# Patient Record
Sex: Female | Born: 1985 | Race: Black or African American | Hispanic: No | Marital: Married | Smoking: Never smoker
Health system: Southern US, Community
[De-identification: ages and names within clinical notes are randomized; demographics above are authoritative.]

---

## 2004-01-02 ENCOUNTER — Emergency Department (HOSPITAL_COMMUNITY): Admission: EM | Admit: 2004-01-02 | Discharge: 2004-01-03 | Payer: Self-pay

## 2006-03-29 ENCOUNTER — Other Ambulatory Visit: Admission: RE | Admit: 2006-03-29 | Discharge: 2006-03-29 | Payer: Self-pay | Admitting: Obstetrics and Gynecology

## 2006-07-04 ENCOUNTER — Ambulatory Visit (HOSPITAL_COMMUNITY): Admission: RE | Admit: 2006-07-04 | Discharge: 2006-07-04 | Payer: Self-pay | Admitting: Obstetrics and Gynecology

## 2006-11-08 ENCOUNTER — Inpatient Hospital Stay (HOSPITAL_COMMUNITY): Admission: AD | Admit: 2006-11-08 | Discharge: 2006-11-11 | Payer: Self-pay | Admitting: Obstetrics and Gynecology

## 2006-12-09 ENCOUNTER — Other Ambulatory Visit: Admission: RE | Admit: 2006-12-09 | Discharge: 2006-12-09 | Payer: Self-pay | Admitting: Obstetrics and Gynecology

## 2007-09-02 IMAGING — US US OB DETAIL+14 WK
1 series · 13 of 28 positions shown · non-contrast
Comparison: none

CLINICAL DATA: 22 week 0 day gestational age by LMP.  Evaluate dating and anatomy.  Echogenic intracardiac focus noted during real time exam.

[Series 1: us ob detail+14 wk · 67 acquisitions, 13 frames shown]
[im 3/67]
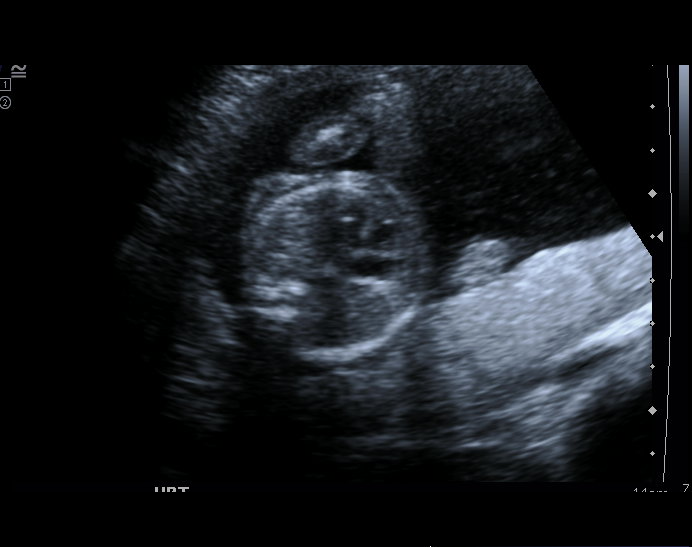
[im 8/67]
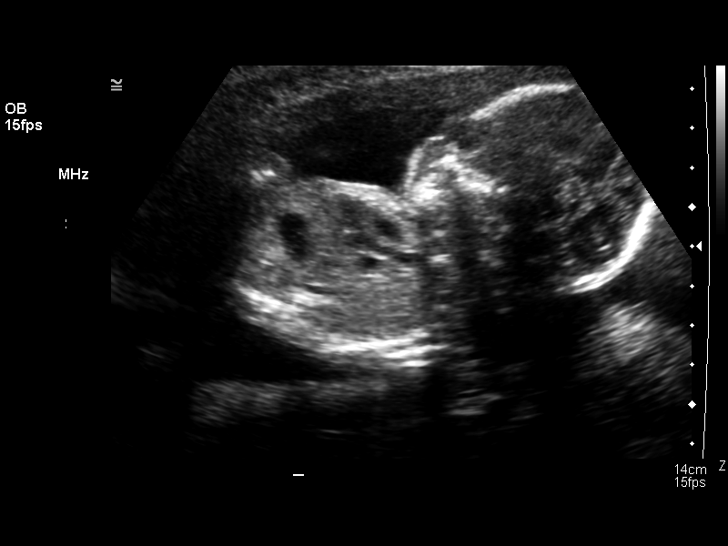
[im 13/67]
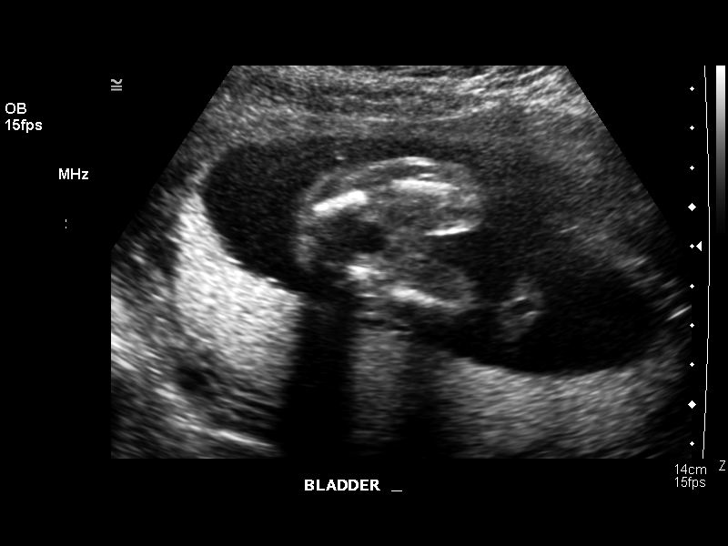
[im 18/67]
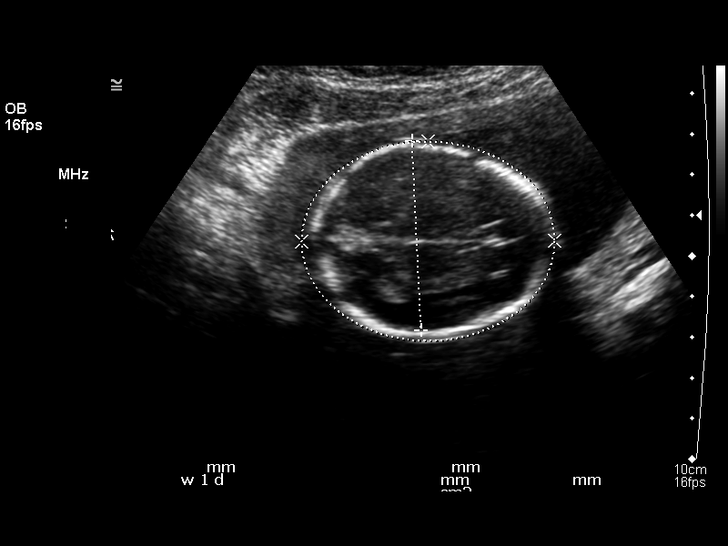
[im 23/67]
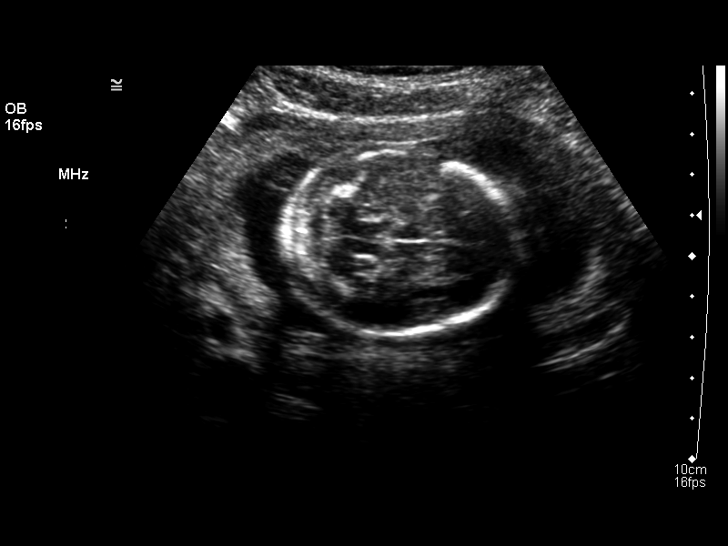
[im 27/67]
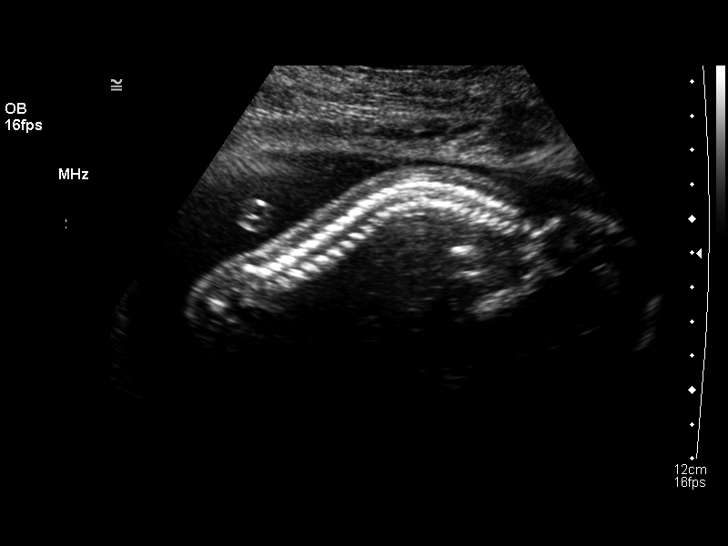
[im 35/67]
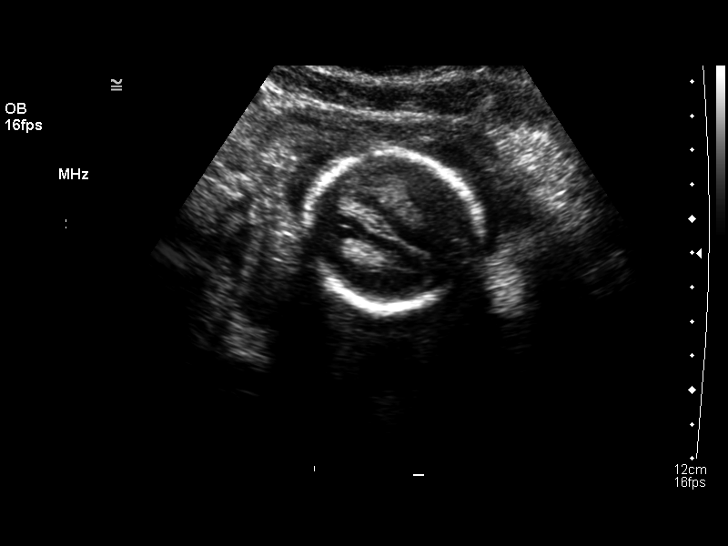
[im 40/67]
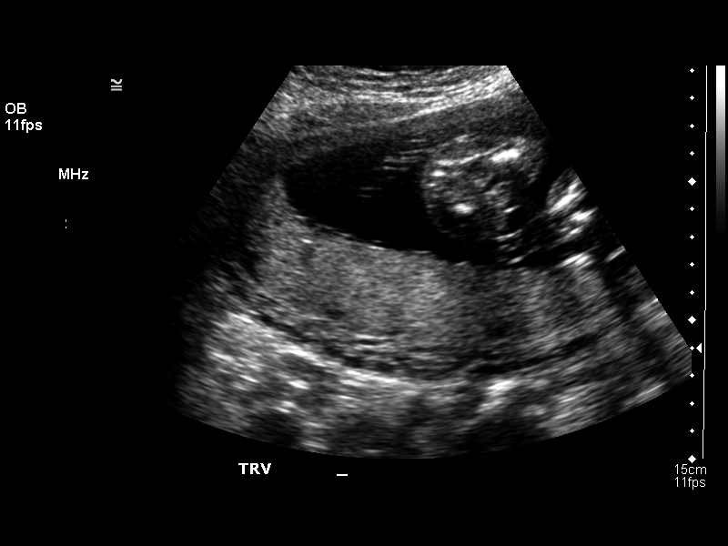
[im 45/67]
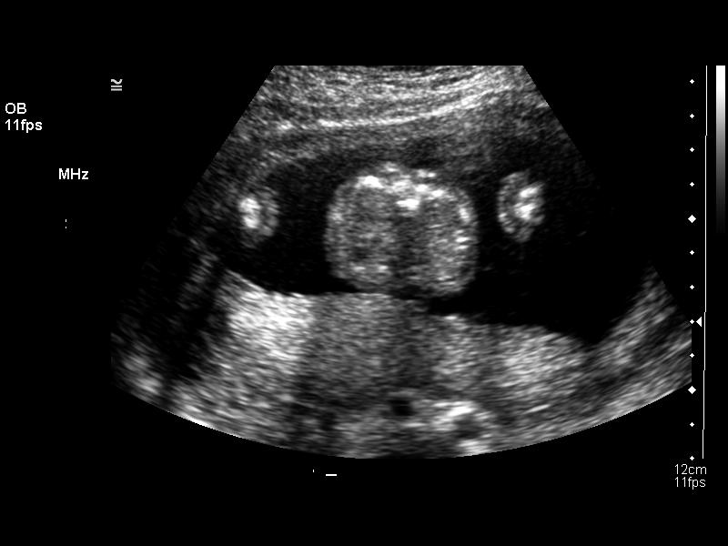
[im 49/67]
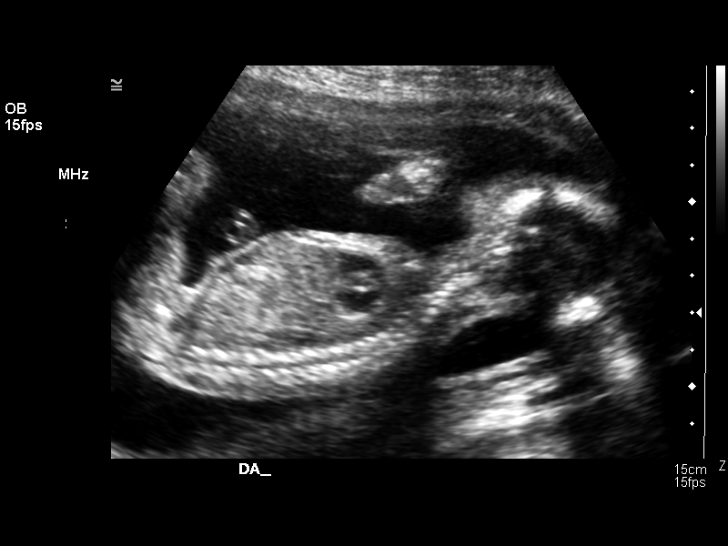
[im 54/67]
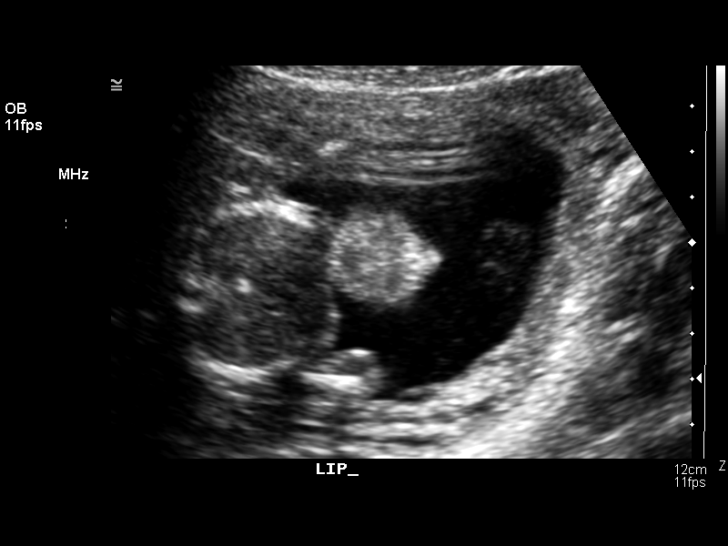
[im 59/67]
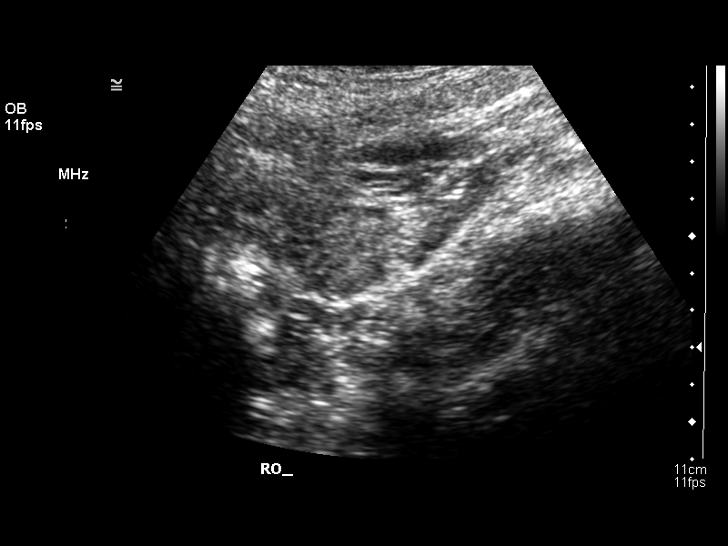
[im 64/67]
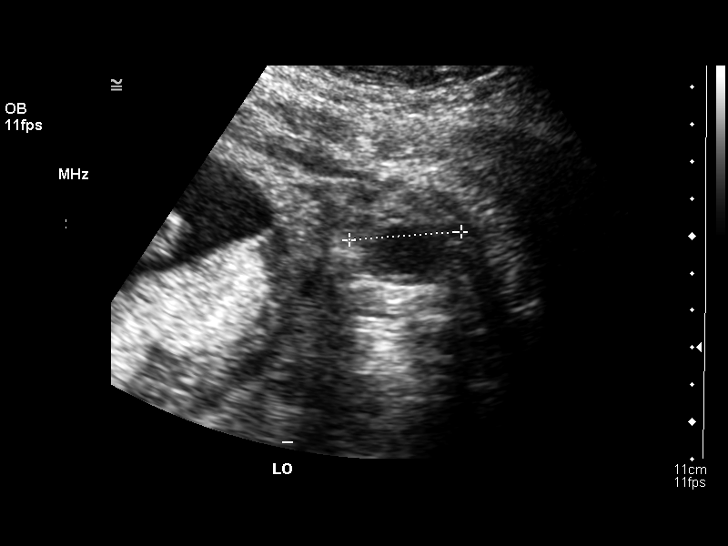

[13 of 28 positions shown; findings below may reference images not displayed]

DETAILED OBSTETRICAL ULTRASOUND:

Number of Fetuses:  1
Heart Rate:  157 bpm
Movement:  Yes
Breathing:  No
Presentation:  Cephalic
Placental Location:  Posterior
Grade:  I
Previa:  No
Amniotic Fluid (Subjective):  Normal
Amniotic Fluid (Objective):  5.5 cm vertical pocket 

FETAL BIOMETRY
BPD:  4.6 cm   20 w 0 d
HC:  17.4 cm  19 w 6 d
AC:  14.5 cm  19 w 6 d
FL:  3.2 cm  20 w 0 d

MEAN GA:  20 w 0 d    US EDC:  11/21/06

FETAL ANATOMY
Lateral Ventricles:  Visualized  
Thalami/CSP:  Visualized  
Posterior Fossa:  Visualized   
Nuchal Region:  (NF=4.0 mm)  Visualized 
Spine:  Visualized  
4 Chamber Heart on Left:  Visualized  
Stomach on Left:  Visualized  
3 Vessel Cord:  Visualized 
Cord Insertion site:  Visualized 
Kidneys:  Visualized   
Bladder:  Visualized   
Extremities:  Visualized  

ADDITIONAL ANATOMY VISUALIZED:  LVOT, RVOT, upper lip, orbits, profile, diaphragm, heel, 5th digit, ductal arch, and aortic arch.

Comment:  An echogenic intracardiac focus is noted in the fetal left ventricle.  No other sonographic markers for aneuploidy are identified.  This is considered a soft sonographic marker for Down syndrome, and increases the age-based risk for 20 year old from [DATE] to [DATE].

MATERNAL UTERINE AND ADNEXAL FINDINGS
Cervix:  4.2 cm transabdominal.
Both ovaries are unremarkable.
IMPRESSION: 1.  Single living intrauterine fetus with mean gestational age of 20 weeks 0 days and US EDC of 11/21/06.  This is 2 weeks behind stated LMP, indicating that LMP is inaccurate.
2.  No evidence of fetal anomalies.  Echogenic intracardiac focus noted, which is considered a soft sonographic marker for Down syndrome.  See above comments.  Recommend correlation with results of maternal serum screening for further risk adjustment if available.

## 2009-03-11 ENCOUNTER — Ambulatory Visit (HOSPITAL_COMMUNITY): Admission: RE | Admit: 2009-03-11 | Discharge: 2009-03-11 | Payer: Self-pay | Admitting: Obstetrics

## 2009-03-28 ENCOUNTER — Observation Stay (HOSPITAL_COMMUNITY): Admission: AD | Admit: 2009-03-28 | Discharge: 2009-03-28 | Payer: Self-pay | Admitting: Obstetrics

## 2010-05-27 IMAGING — US US OB LIMITED
1 series · 14 of 18 positions shown · non-contrast
Comparison: none

OBSTETRICAL ULTRASOUND:
 This ultrasound was performed in The [HOSPITAL], and the [facility] report will be stored to [REDACTED] PACS.

[Series 1: us ob limited · 14 of 18 slices shown]
[im 1/18]
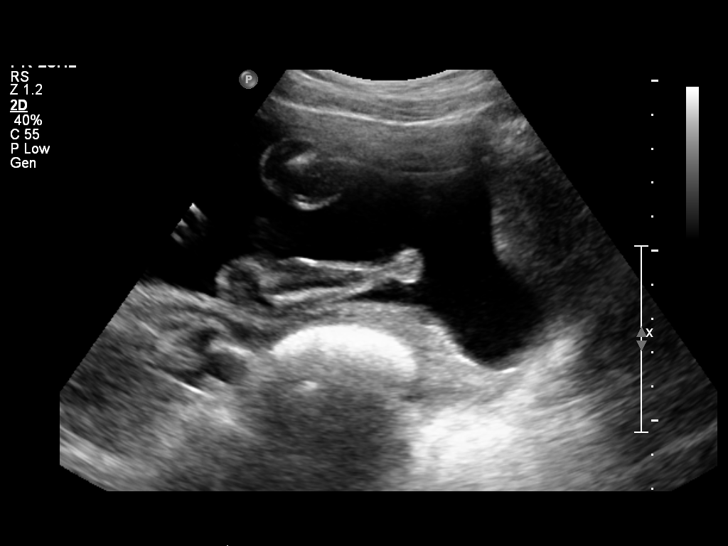
[im 2/18]
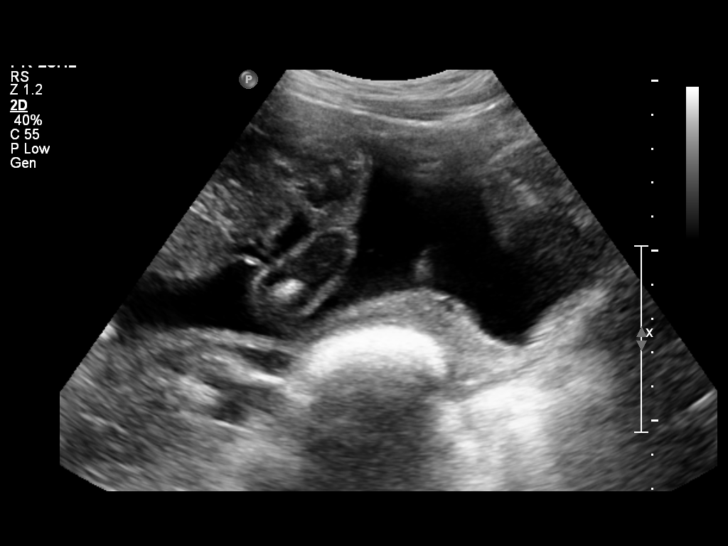
[im 4/18]
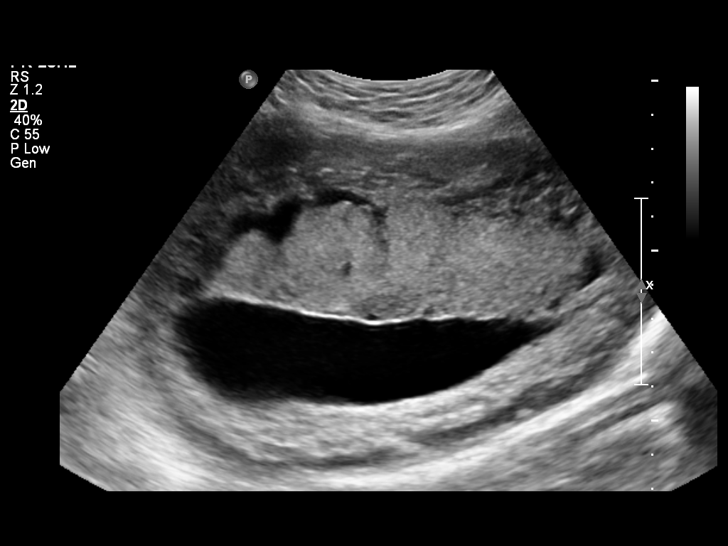
[im 5/18]
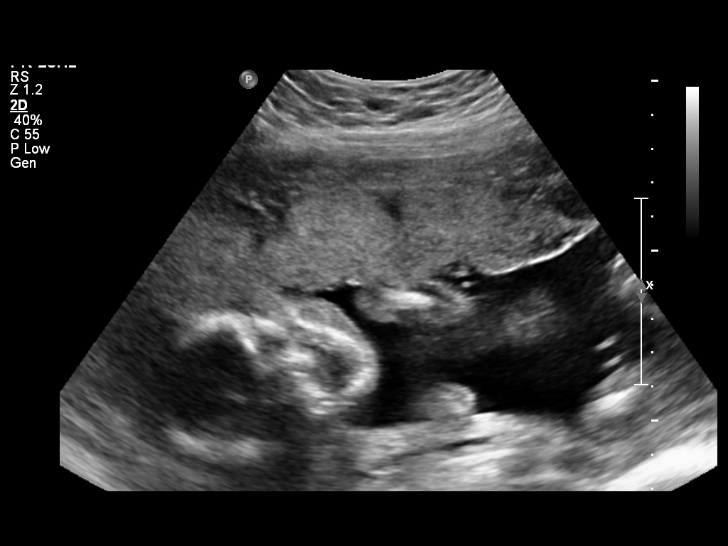
[im 6/18]
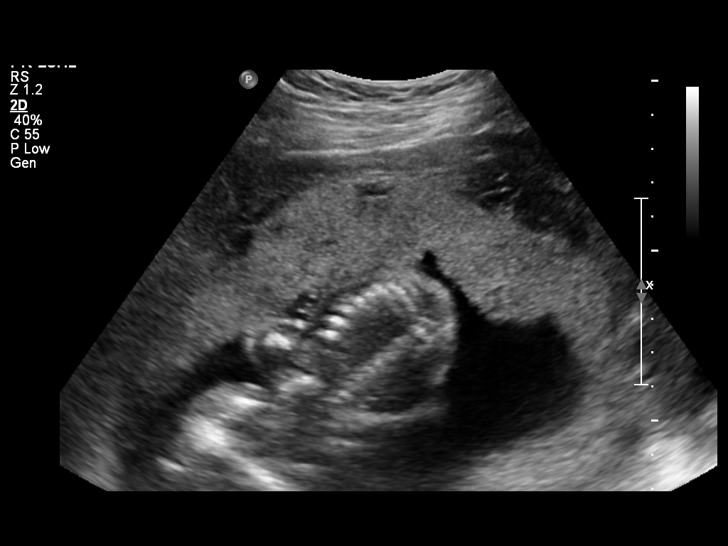
[im 8/18]
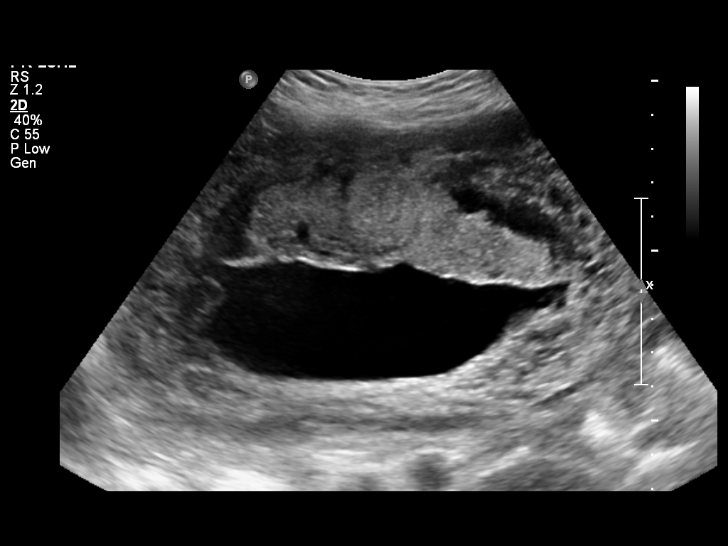
[im 9/18]
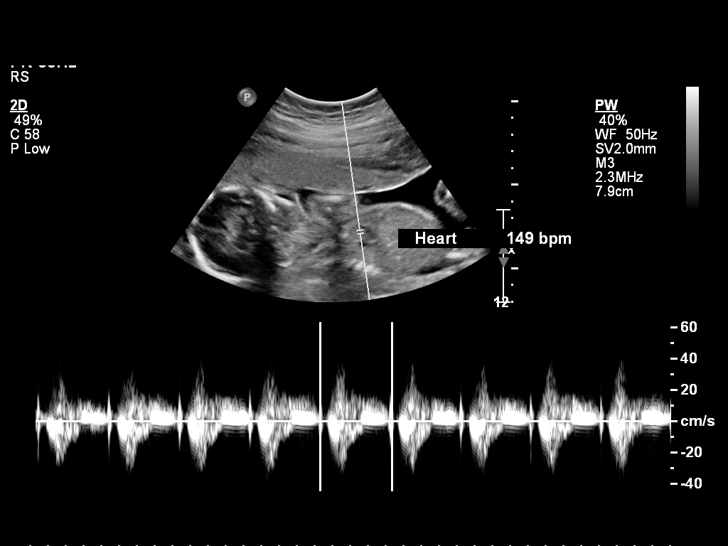
[im 10/18]
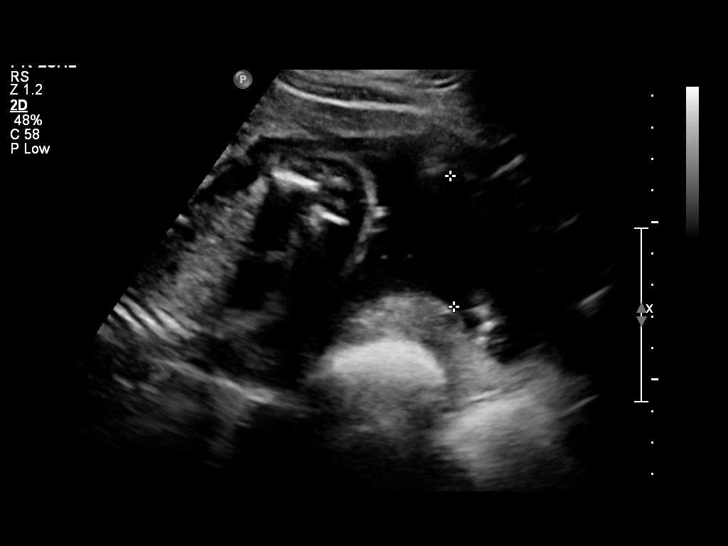
[im 11/18]
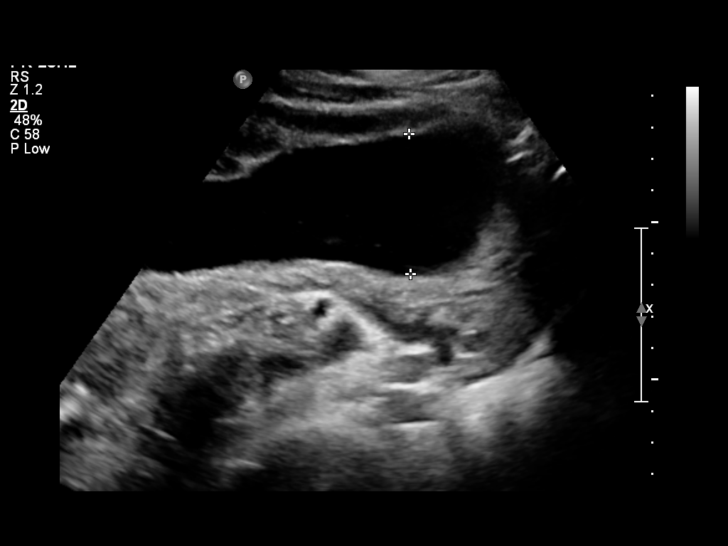
[im 13/18]
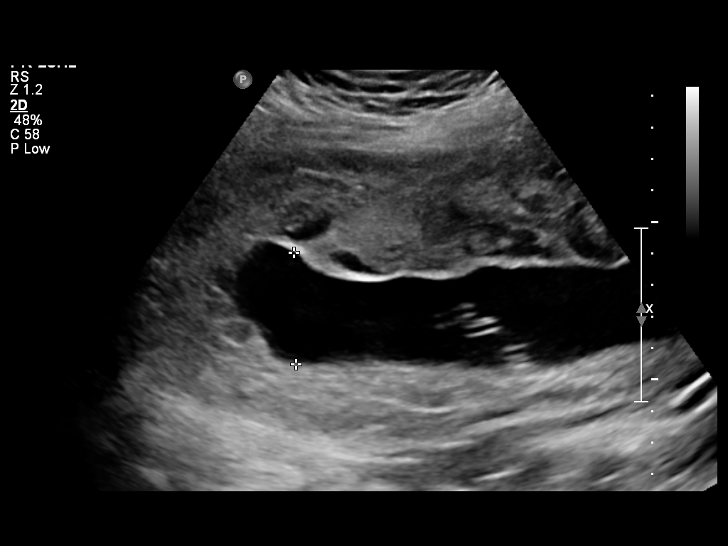
[im 14/18]
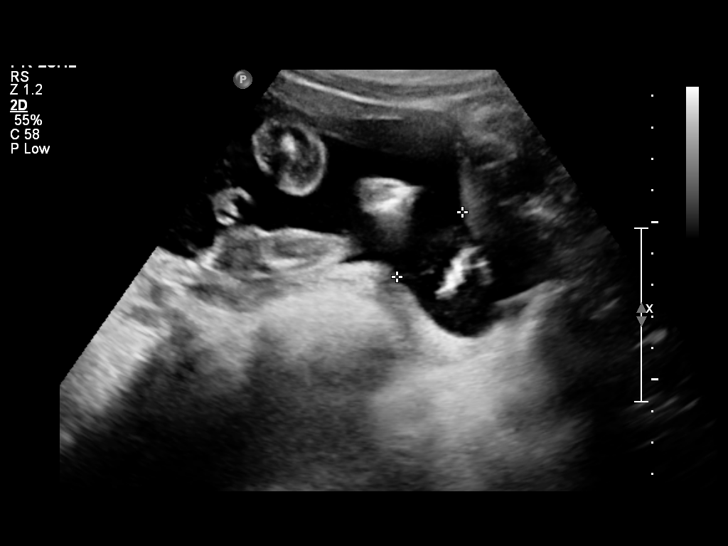
[im 15/18]
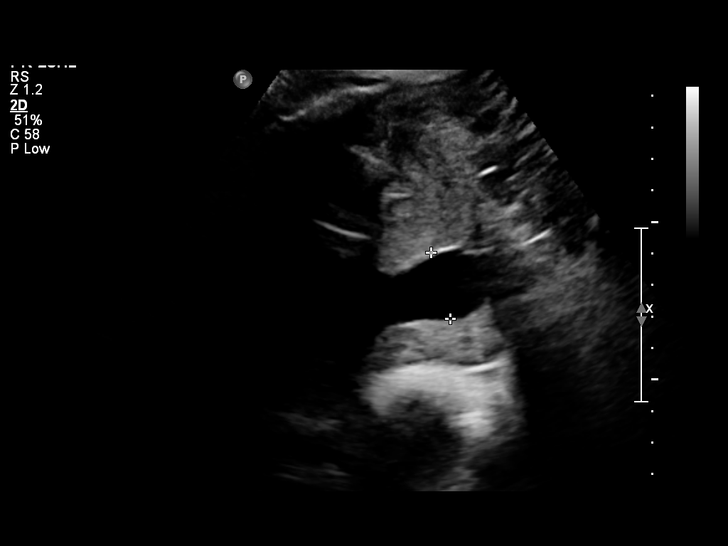
[im 17/18]
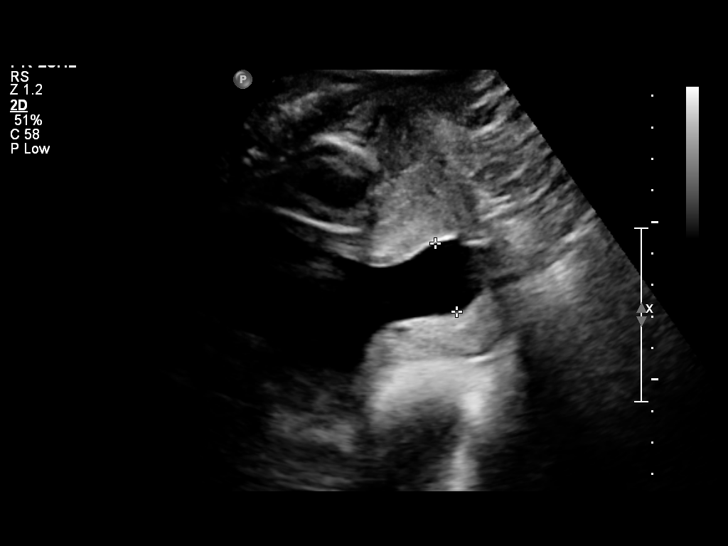
[im 18/18]
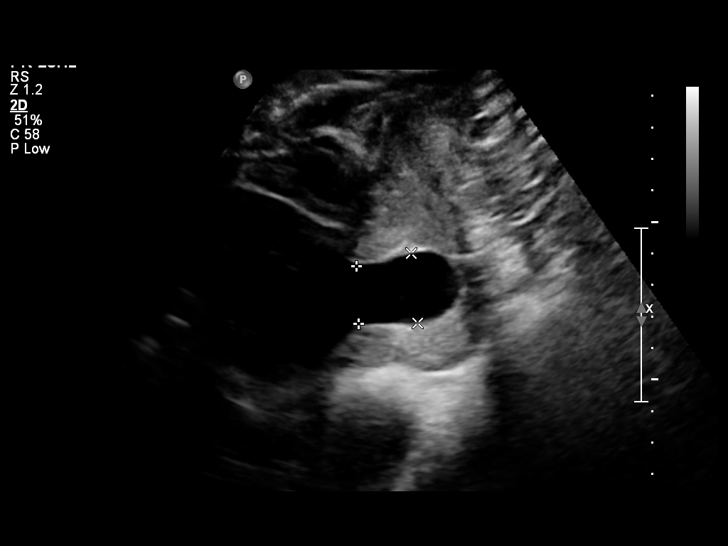

[14 of 18 positions shown; findings below may reference images not displayed]

IMPRESSION: [facility] has also been faxed to the ordering physician.

## 2010-12-07 ENCOUNTER — Encounter: Payer: Self-pay | Admitting: Obstetrics & Gynecology

## 2011-02-23 LAB — CBC
HCT: 34.4 % — ABNORMAL LOW (ref 36.0–46.0)
Hemoglobin: 11.9 g/dL — ABNORMAL LOW (ref 12.0–15.0)
MCHC: 34.5 g/dL (ref 30.0–36.0)
MCV: 92.5 fL (ref 78.0–100.0)
Platelets: 222 10*3/uL (ref 150–400)
RBC: 3.72 MIL/uL — ABNORMAL LOW (ref 3.87–5.11)
RDW: 13.3 % (ref 11.5–15.5)
WBC: 8.1 10*3/uL (ref 4.0–10.5)

## 2011-04-02 NOTE — Discharge Summary (Signed)
Ashley Mcdowell, Ashley Mcdowell               ACCOUNT NO.:  [id number]   MEDICAL RECORD NO.:  [id number]          PATIENT TYPE:  INP   LOCATION:  9106                          FACILITY:  [facility]   PHYSICIAN:  Rudy Jew. Ashley Royalty, M.D.DATE OF BIRTH:  1986-06-02   DATE OF ADMISSION:  11/08/2006  DATE OF DISCHARGE:  11/11/2006                               DISCHARGE SUMMARY   DISCHARGE DIAGNOSES:  1. Intrauterine pregnancy at term, delivered.  2. Term birth living child, vertex.  3. Group B strep carrier.   OPERATIONS AND PROCEDURES:  OB delivery with episiotomy, episiorrhaphy.   CONSULTATIONS:  None.   DISCHARGE MEDICATIONS:  Motrin 60 mg.   HISTORY AND PHYSICAL:  This is a 25 year old primigravida at 72-weeks 6-  days gestation.  Prenatal care was essentially uncomplicated.  The  patient presented at 4:00 p.m. December 25 complaining of possible  rupture of membranes.  In maternity admissions, no premature rupture of  membranes could be confirmed.  However, the patient was noted to have  frequent contractions.  The cervix was noted to be 4 cm dilated, 80%  effaced, -2 station, vertex presentation.  The patient was hence  admitted for delivery.  She is group B strep positive.  Initial cervical  examination revealed cervix to be approximately 4+ cm dilated, 70%  effaced, -2 station, vertex presentation.  For the remainder of the  history physical, please see chart.   HOSPITAL COURSE:  The patient was admitted to [facility]  [place].  Admission laboratory studies were drawn.  Artificial  rupture of membranes was accomplished which revealed clear fluid.  Intrauterine pressure catheter was placed.  The patient went on to labor  and deliver on November 09, 2006.  The infant was a 5-pound, 15-ounces  female, Apgars 8 at one minute and 9 at five minutes, sent to newborn  nursery.  Delivery was accomplished by Dr. Sylvester Harder over a second-  degree midline episiotomy which was repaired  without difficulty.  Delivery was accomplished with the vacuum extractor secondary to  inadequate expulsive effort.  There were no complications.  The patient  received a spinal anesthetic as well as local - 1% Xylocaine  (approximately 10 mL total).  The patient's postpartum course was  benign.  She was discharged on the second postpartum day afebrile and in  satisfactory condition.   DISPOSITION:  The patient is to return in approximately 6 weeks for  postoperative evaluation.      James A. Ashley Royalty, M.D.  Electronically Signed     JAM/MEDQ  D:  11/30/2006  T:  11/30/2006  Job:  [id number]

## 2012-12-22 ENCOUNTER — Ambulatory Visit: Payer: Self-pay | Admitting: Family Medicine

## 2012-12-22 VITALS — BP 116/73 | HR 91 | Temp 100.6°F | Resp 18 | Ht 62.5 in | Wt 145.0 lb

## 2012-12-22 DIAGNOSIS — J020 Streptococcal pharyngitis: Secondary | ICD-10-CM

## 2012-12-22 DIAGNOSIS — R6889 Other general symptoms and signs: Secondary | ICD-10-CM

## 2012-12-22 DIAGNOSIS — J111 Influenza due to unidentified influenza virus with other respiratory manifestations: Secondary | ICD-10-CM

## 2012-12-22 DIAGNOSIS — J029 Acute pharyngitis, unspecified: Secondary | ICD-10-CM

## 2012-12-22 LAB — POCT INFLUENZA A/B
Influenza A, POC: NEGATIVE
Influenza B, POC: NEGATIVE

## 2012-12-22 LAB — POCT RAPID STREP A (OFFICE): Rapid Strep A Screen: POSITIVE — AB

## 2012-12-22 MED ORDER — AMOXICILLIN 875 MG PO TABS: 875.0000 mg | ORAL_TABLET | Freq: Two times a day (BID) | ORAL | Status: DC

## 2012-12-22 NOTE — Patient Instructions (Signed)

## 2012-12-22 NOTE — Progress Notes (Signed)
  [facility]:  Office Visit  Chief Complaint:  Chief Complaint  Patient presents with  . Chills    yesterday  . Fever  . Generalized Body Aches    HPI: Ashley Mcdowell is a 27 y.o. female who complains of  Fevers Tmax 100, chills and msk aches. She has 28 y/o and  28 y/o. No  sick contacts with kids. Her sister had the flu. Took ibuprofen without relief. Has not had flu vaccine, but both her children did.   History reviewed. No pertinent past medical history. History reviewed. No pertinent past surgical history. History   Social History  . Marital Status: Married    Spouse Name: N/A    Number of Children: N/A  . Years of Education: N/A   Social History Main Topics  . Smoking status: Never Smoker   . Smokeless tobacco: None  . Alcohol Use: No  . Drug Use: No  . Sexually Active: Yes   Other Topics Concern  . None   Social History Narrative  . None   History reviewed. No pertinent family history. No Known Allergies Prior to Admission medications   Medication Sig Start Date End Date Taking? Authorizing Provider  UNABLE TO FIND Ripped [employer] Vitamines   Yes Historical Provider, MD     ROS: The patient denies night sweats, unintentional weight loss, chest pain, palpitations, wheezing, dyspnea on exertion, nausea, vomiting, abdominal pain, dysuria, hematuria, melena, numbness, weakness, or tingling.  All other systems have been reviewed and were otherwise negative with the exception of those mentioned in the HPI and as above.    PHYSICAL EXAM: Filed Vitals:   12/22/12 1229  BP: 116/73  Pulse: 91  Temp: 100.6 F (38.1 C)  Resp: 18   Filed Vitals:   12/22/12 1229  Height: 5' 2.5" (1.588 m)  Weight: 145 lb (65.772 kg)   Body mass index is 26.10 kg/(m^2).  General: Alert, no acute distress HEENT:  Normocephalic, atraumatic, oropharynx patent. TM bulging but WNL. + erythematous throat. No exudates. NO sinus tenderness.  Cardiovascular:  Regular  rate and rhythm, no rubs murmurs or gallops.  No Carotid bruits, radial pulse intact. No pedal edema.  Respiratory: Clear to auscultation bilaterally.  No wheezes, rales, or rhonchi.  No cyanosis, no use of accessory musculature GI: No organomegaly, abdomen is soft and non-tender, positive bowel sounds.  No masses. Skin: No rashes. Neurologic: Facial musculature symmetric. Psychiatric: Patient is appropriate throughout our interaction. Lymphatic: No cervical lymphadenopathy Musculoskeletal: Gait intact.   LABS: Results for orders placed in visit on 12/22/12  POCT INFLUENZA A/B      Component Value Range   Influenza A, POC Negative     Influenza B, POC Negative    POCT RAPID STREP A (OFFICE)      Component Value Range   Rapid Strep A Screen Positive (*) Negative     EKG/XRAY:   Primary read interpreted by Dr. Conley Rolls at [facility].   ASSESSMENT/PLAN: Encounter Diagnoses  Name Primary?  . Flu-like symptoms   . Pharyngitis   . Strep throat Yes   Strep Pharyngitis Rx Amoxacillin F/u prn    Matej Sappenfield PHUONG, DO 12/22/2012 1:23 PM

## 2014-07-04 ENCOUNTER — Encounter (HOSPITAL_COMMUNITY): Payer: Self-pay | Admitting: Emergency Medicine

## 2014-07-04 ENCOUNTER — Emergency Department (HOSPITAL_COMMUNITY)
Admission: EM | Admit: 2014-07-04 | Discharge: 2014-07-04 | Disposition: A | Discharge status: Home / Self Care | Payer: No Typology Code available for payment source | Attending: Emergency Medicine | Admitting: Emergency Medicine

## 2014-07-04 DIAGNOSIS — Y92410 Unspecified street and highway as the place of occurrence of the external cause: Secondary | ICD-10-CM | POA: Insufficient documentation

## 2014-07-04 DIAGNOSIS — S4980XA Other specified injuries of shoulder and upper arm, unspecified arm, initial encounter: Secondary | ICD-10-CM | POA: Diagnosis not present

## 2014-07-04 DIAGNOSIS — Z792 Long term (current) use of antibiotics: Secondary | ICD-10-CM | POA: Diagnosis not present

## 2014-07-04 DIAGNOSIS — Y9389 Activity, other specified: Secondary | ICD-10-CM | POA: Insufficient documentation

## 2014-07-04 DIAGNOSIS — M62838 Other muscle spasm: Secondary | ICD-10-CM | POA: Diagnosis not present

## 2014-07-04 DIAGNOSIS — S46909A Unspecified injury of unspecified muscle, fascia and tendon at shoulder and upper arm level, unspecified arm, initial encounter: Secondary | ICD-10-CM | POA: Diagnosis not present

## 2014-07-04 DIAGNOSIS — M25511 Pain in right shoulder: Secondary | ICD-10-CM

## 2014-07-04 MED ORDER — OXYCODONE-ACETAMINOPHEN 5-325 MG PO TABS: 1.0000 | ORAL_TABLET | ORAL | Status: DC | PRN

## 2014-07-04 MED ORDER — METHOCARBAMOL 500 MG PO TABS: 500.0000 mg | ORAL_TABLET | Freq: Two times a day (BID) | ORAL | Status: DC

## 2014-07-04 NOTE — Discharge Instructions (Signed)
Take the prescribed medication as directed.  You will likely continue to have some soreness for the next few days which is normal. Follow-up with your primary care physician if problems occur. Return to the ED for new or worsening symptoms.
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
## 2014-07-04 NOTE — ED Provider Notes (Signed)
Medical screening examination/treatment/procedure(s) were performed by non-physician practitioner and as supervising physician I was immediately available for consultation/collaboration.   Quaron Delacruz T Keyera Hattabaugh, MD 07/04/14 2308 

## 2014-08-28 ENCOUNTER — Ambulatory Visit: Payer: Self-pay | Attending: Orthopedic Surgery | Admitting: Physical Therapy

## 2014-08-28 DIAGNOSIS — M542 Cervicalgia: Secondary | ICD-10-CM | POA: Insufficient documentation

## 2014-09-10 ENCOUNTER — Ambulatory Visit: Payer: Self-pay | Admitting: Physical Therapy

## 2014-09-11 ENCOUNTER — Ambulatory Visit: Payer: Self-pay | Admitting: Physical Therapy

## 2014-09-12 ENCOUNTER — Ambulatory Visit: Payer: Self-pay | Admitting: Physical Therapy

## 2014-09-13 ENCOUNTER — Ambulatory Visit: Payer: Self-pay | Admitting: Physical Therapy

## 2014-09-18 ENCOUNTER — Ambulatory Visit: Payer: No Typology Code available for payment source | Attending: Orthopedic Surgery | Admitting: Physical Therapy

## 2014-09-18 DIAGNOSIS — M542 Cervicalgia: Secondary | ICD-10-CM | POA: Insufficient documentation

## 2014-09-23 ENCOUNTER — Ambulatory Visit: Payer: No Typology Code available for payment source | Admitting: Physical Therapy

## 2014-09-23 DIAGNOSIS — M542 Cervicalgia: Secondary | ICD-10-CM | POA: Diagnosis not present

## 2014-09-27 ENCOUNTER — Ambulatory Visit: Payer: No Typology Code available for payment source | Admitting: Physical Therapy

## 2015-08-19 ENCOUNTER — Encounter: Payer: Self-pay | Admitting: Emergency Medicine

## 2016-12-03 ENCOUNTER — Ambulatory Visit: Payer: Self-pay | Admitting: Nurse Practitioner

## 2016-12-15 ENCOUNTER — Other Ambulatory Visit (INDEPENDENT_AMBULATORY_CARE_PROVIDER_SITE_OTHER)

## 2016-12-15 ENCOUNTER — Encounter: Payer: Self-pay | Admitting: Nurse Practitioner

## 2016-12-15 ENCOUNTER — Ambulatory Visit (INDEPENDENT_AMBULATORY_CARE_PROVIDER_SITE_OTHER): Admitting: Nurse Practitioner

## 2016-12-15 ENCOUNTER — Other Ambulatory Visit (HOSPITAL_COMMUNITY)
Admission: RE | Admit: 2016-12-15 | Discharge: 2016-12-15 | Disposition: A | Discharge status: Home / Self Care | Source: Ambulatory Visit | Attending: Nurse Practitioner | Admitting: Nurse Practitioner

## 2016-12-15 VITALS — BP 126/78 | HR 75 | Temp 97.5°F | Ht 63.0 in | Wt 197.0 lb

## 2016-12-15 DIAGNOSIS — Z0000 Encounter for general adult medical examination without abnormal findings: Secondary | ICD-10-CM

## 2016-12-15 DIAGNOSIS — Z23 Encounter for immunization: Secondary | ICD-10-CM

## 2016-12-15 DIAGNOSIS — Z113 Encounter for screening for infections with a predominantly sexual mode of transmission: Secondary | ICD-10-CM | POA: Diagnosis present

## 2016-12-15 DIAGNOSIS — L309 Dermatitis, unspecified: Secondary | ICD-10-CM | POA: Diagnosis not present

## 2016-12-15 DIAGNOSIS — Z124 Encounter for screening for malignant neoplasm of cervix: Secondary | ICD-10-CM

## 2016-12-15 DIAGNOSIS — Z01419 Encounter for gynecological examination (general) (routine) without abnormal findings: Secondary | ICD-10-CM | POA: Diagnosis not present

## 2016-12-15 DIAGNOSIS — L2082 Flexural eczema: Secondary | ICD-10-CM | POA: Insufficient documentation

## 2016-12-15 LAB — CBC WITH DIFFERENTIAL/PLATELET
BASOS PCT: 0.8 % (ref 0.0–3.0)
Basophils Absolute: 0 10*3/uL (ref 0.0–0.1)
EOS ABS: 0.1 10*3/uL (ref 0.0–0.7)
EOS PCT: 1.8 % (ref 0.0–5.0)
HEMATOCRIT: 40.3 % (ref 36.0–46.0)
HEMOGLOBIN: 13.3 g/dL (ref 12.0–15.0)
Lymphocytes Relative: 42.9 % (ref 12.0–46.0)
Lymphs Abs: 2.4 10*3/uL (ref 0.7–4.0)
MCHC: 33.1 g/dL (ref 30.0–36.0)
MCV: 89.9 fl (ref 78.0–100.0)
MONO ABS: 0.4 10*3/uL (ref 0.1–1.0)
Monocytes Relative: 6.4 % (ref 3.0–12.0)
NEUTROS ABS: 2.7 10*3/uL (ref 1.4–7.7)
Neutrophils Relative %: 48.1 % (ref 43.0–77.0)
PLATELETS: 297 10*3/uL (ref 150.0–400.0)
RBC: 4.48 Mil/uL (ref 3.87–5.11)
RDW: 13.3 % (ref 11.5–15.5)
WBC: 5.6 10*3/uL (ref 4.0–10.5)

## 2016-12-15 LAB — COMPREHENSIVE METABOLIC PANEL
ALBUMIN: 4.5 g/dL (ref 3.5–5.2)
ALT: 39 U/L — ABNORMAL HIGH (ref 0–35)
AST: 37 U/L (ref 0–37)
Alkaline Phosphatase: 49 U/L (ref 39–117)
BUN: 11 mg/dL (ref 6–23)
CHLORIDE: 107 meq/L (ref 96–112)
CO2: 25 meq/L (ref 19–32)
CREATININE: 0.87 mg/dL (ref 0.40–1.20)
Calcium: 9.7 mg/dL (ref 8.4–10.5)
GFR: 97.59 mL/min (ref >60.00)
Glucose, Bld: 81 mg/dL (ref 70–99)
POTASSIUM: 3.6 meq/L (ref 3.5–5.1)
SODIUM: 139 meq/L (ref 135–145)
Total Bilirubin: 0.5 mg/dL (ref 0.2–1.2)
Total Protein: 8.5 g/dL — ABNORMAL HIGH (ref 6.0–8.3)

## 2016-12-15 LAB — LIPID PANEL
CHOL/HDL RATIO: 4
CHOLESTEROL: 176 mg/dL (ref 0–200)
HDL: 48.7 mg/dL (ref >39.00)
LDL CALC: 115 mg/dL — AB (ref 0–99)
NonHDL: 127.1
TRIGLYCERIDES: 61 mg/dL (ref 0.0–149.0)
VLDL: 12.2 mg/dL (ref 0.0–40.0)

## 2016-12-15 LAB — TSH: TSH: 1.46 u[IU]/mL (ref 0.35–4.50)

## 2016-12-15 MED ORDER — TRIAMCINOLONE ACETONIDE 0.5 % EX OINT: 1.0000 "application " | TOPICAL_OINTMENT | Freq: Two times a day (BID) | CUTANEOUS | 0 refills | Status: DC

## 2016-12-15 NOTE — Progress Notes (Signed)
Subjective:    Patient ID: Ashley Mcdowell, female    DOB: March 12, 1986, 32 y.o.   MRN: [id number]  Patient presents today for establish care (new patient) and CPE.  HPI  Immunizations: (TDAP, Hep C screen, Pneumovax, Influenza, zoster)  Health Maintenance  Topic Date Due  . HIV Screening  12/16/2017*  . Pap Smear  12/16/2019  . Tetanus Vaccine  05/15/2024  . Flu Shot  Completed  *Topic was postponed. The date shown is not the original due date.   Diet:healthy Weight:  Wt Readings from Last 3 Encounters:  12/15/16 197 lb (89.4 kg)  07/04/14 160 lb (72.6 kg)  12/22/12 145 lb (65.8 kg)   Exercise:goes to GYN 3x/week Fall Risk: Fall Risk  12/15/2016  Falls in the past year? No   Home Safety:home with husband and chidren Depression/Suicide: Depression screen [facility] 2/9 12/15/2016  Decreased Interest 0  Down, Depressed, Hopeless 0  PHQ - 2 Score 0   No flowsheet data found. Pap Smear (every 20yrs for >21-29 without HPV, every 75yrs for >30-67yrs with HPV):needed Vision:up to date, use of contact lens Dental:up to date Sexual History (birth control, marital status, STD):married, 2children, Student  Medications and allergies reviewed with patient and updated if appropriate.  Patient Active Problem List   Diagnosis Date Noted  . Eczema 12/15/2016    No current outpatient prescriptions on file prior to visit.   No current facility-administered medications on file prior to visit.     History reviewed. No pertinent past medical history.  History reviewed. No pertinent surgical history.  Social History   Social History  . Marital status: Married    Spouse name: N/A  . Number of children: N/A  . Years of education: N/A   Social History Main Topics  . Smoking status: Never Smoker  . Smokeless tobacco: Never Used  . Alcohol use Yes     Comment: social  . Drug use: No  . Sexual activity: Yes    Birth control/ protection: None, Other-see comments, Surgical   Comment: husband has vasectomy   Other Topics Concern  . None   Social History Narrative  . None    Family History  Problem Relation Age of Onset  . Hypertension Mother   . Hyperlipidemia Father   . Arthritis Father 50    rheumatoid  . Hyperlipidemia Paternal Grandmother   . Arthritis Paternal Grandmother     rheumatoid        Review of Systems  Constitutional: Negative for fever, malaise/fatigue and weight loss.  HENT: Negative for congestion and sore throat.   Eyes:       Negative for visual changes  Respiratory: Negative for cough and shortness of breath.   Cardiovascular: Negative for chest pain, palpitations and leg swelling.  Gastrointestinal: Negative for blood in stool, constipation, diarrhea and heartburn.  Genitourinary: Negative for dysuria, frequency and urgency.  Musculoskeletal: Negative for falls, joint pain and myalgias.  Skin: Negative for rash.  Neurological: Negative for dizziness, sensory change and headaches.  Endo/Heme/Allergies: Does not bruise/bleed easily.  Psychiatric/Behavioral: Negative for depression, substance abuse and suicidal ideas. The patient is not nervous/anxious.     Objective:   Vitals:   12/15/16 1105  BP: 126/78  Pulse: 75  Temp: 97.5 F (36.4 C)    Body mass index is 34.9 kg/m.   Physical Examination:  Physical Exam  Constitutional: She is oriented to person, place, and time and well-developed, well-nourished, and in no distress. No distress.  HENT:  Right Ear: External ear normal.  Left Ear: External ear normal.  Nose: Nose normal.  Mouth/Throat: No oropharyngeal exudate.  Eyes: Conjunctivae and EOM are normal. Pupils are equal, round, and reactive to light. No scleral icterus.  Neck: Normal range of motion. Neck supple. No thyromegaly present.  Cardiovascular: Normal rate, regular rhythm, normal heart sounds and intact distal pulses.   Pulmonary/Chest: Effort normal and breath sounds normal. Right breast  exhibits no mass, no nipple discharge and no skin change. Left breast exhibits no mass, no nipple discharge and no skin change. Breasts are symmetrical.  Abdominal: Soft. Bowel sounds are normal. She exhibits no distension. There is no tenderness.  Genitourinary: Rectum normal, cervix normal, right adnexa normal, left adnexa normal and vulva normal. Cervix exhibits no motion tenderness. Thick  curdy  odorless  white and vaginal discharge found.  Musculoskeletal: Normal range of motion. She exhibits no edema or tenderness.  Lymphadenopathy:    She has no cervical adenopathy.  Neurological: She is alert and oriented to person, place, and time. Gait normal.  Skin: Skin is warm, dry and intact. Rash noted. Rash is macular.     Scaly annular patches with excoriation marks.  Psychiatric: Affect and judgment normal.  Vitals reviewed.   ASSESSMENT and PLAN:  Ashley Mcdowell was seen today for establish care.  Diagnoses and all orders for this visit:  Encounter for preventative adult health care examination -     TSH; Future -     Comprehensive metabolic panel; Future -     CBC w/Diff; Future -     Lipid panel; Future  Eczema, unspecified type -     triamcinolone ointment (KENALOG) 0.5 %; Apply 1 application topically 2 (two) times daily.  Encounter for screening for cervical cancer  -     Cytology - PAP; Future  Encounter for immunization -     Flu Vaccine QUAD 36+ mos IM    No problem-specific Assessment & Plan notes found for this encounter.     Follow up: Return in about 1 year (around 12/15/2017) for CPE (fasting).  Alysia Pennaharlotte Aviah Sorci, NP

## 2016-12-15 NOTE — Progress Notes (Signed)
Pre visit review using our clinic review tool, if applicable. No additional management support is needed unless otherwise documented below in the visit note. 

## 2016-12-15 NOTE — Patient Instructions (Signed)
Atopic Dermatitis Atopic dermatitis is a skin disorder that causes inflammation of the skin. This is the most common type of eczema. Eczema is a group of skin conditions that cause the skin to be itchy, red, and swollen. This condition is generally worse during the cooler winter months and often improves during the warm summer months. Symptoms can vary from person to person. Atopic dermatitis usually starts showing signs in infancy and can last through adulthood. This condition cannot be passed from one person to another (non-contagious), but is more common in families. Atopic dermatitis may not always be present. When it is present, it is called a flare-up. What are the causes? The exact cause of this condition is not known. Flare-ups of the condition may be triggered by:  Contact with something you are sensitive or allergic to.  Stress.  Certain foods.  Extremely hot or cold weather.  Harsh chemicals and soaps.  Dry air.  Chlorine. What increases the risk? This condition is more likely to develop in people who have a personal history or family history of eczema, allergies, asthma, or hay fever. What are the signs or symptoms? Symptoms of this condition include:  Dry, scaly skin.  Red, itchy rash.  Itchiness, which can be severe. This may occur before the skin rash. This can make sleeping difficult.  Skin thickening and cracking can occur over time. How is this diagnosed? This condition is diagnosed based on your symptoms, a medical history, and a physical exam. How is this treated? There is no cure for this condition, but symptoms can usually be controlled. Treatment focuses on:  Controlling the itching and scratching. You may be given medicines, such as antihistamines or steroid creams.  Limiting exposure to things that you are sensitive or allergic to (allergens).  Recognizing situations that cause stress and developing a plan to manage stress. If your atopic dermatitis  does not get better with medicines or is all over your body (widespread) , a treatment using a specific type of light (phototherapy) may be used. Follow these instructions at home: Skin care  Keep your skin well-moisturized. This seals in moisture and help prevent dryness.  Use unscented lotions that have petroleum in them.  Avoid lotions that contain alcohol and water. They can dry the skin.  Keep baths or showers short (less than 5 minutes) in warm water. Do not use hot water.  Use mild, unscented cleansers for bathing. Avoid soap and bubble bath.  Apply a moisturizer to your skin right after a bath or shower.   Do not apply anything to your skin without checking with your health care provider. General instructions  Dress in clothes made of cotton or cotton blends. Dress lightly because heat increases itching.  When washing your clothes, rinse your clothes twice so all of the soap is removed.  Avoid any triggers that can cause a flare-up.  Try to manage your stress.  Keep your fingernails cut short.  Avoid scratching. Scratching makes the rash and itching worse. It may also result in a skin infection (impetigo) due to a break in the skin caused by scratching.  Take or apply over-the-counter and prescription medicines only as told by your health care provider.  Keep all follow-up visits as told by your health care provider. This is important.  Do not be around people who have cold sores or fever blisters. If you get the infection, it may cause your atopic dermatitis to worsen. Contact a health care provider if:  Your itching   interferes with sleep.  Your rash gets worse or is not better within one week of starting treatment.  You have a fever.  You have a rash flare-up after having contact with someone who has cold sores or fever blisters. Get help right away if:  You develop pus or soft yellow scabs in the rash area. Summary  This condition causes a red rash and  itchy, dry, scaly skin.  Treatment focuses on controlling the itching and scratching, limiting exposure to things that you are sensitive or allergic to (allergens), and recognizing situations that cause stress and developing a plan to manage stress.  Keep your skin well-moisturized.  Keep baths or showers less than 5 minutes. This information is not intended to replace advice given to you by your health care provider. Make sure you discuss any questions you have with your health care provider. Document Released: 10/29/2000 Document Revised: 04/08/2016 Document Reviewed: 06/04/2013 Elsevier Interactive Patient Education  2017 [employer]  

## 2016-12-17 LAB — CYTOLOGY - PAP
Adequacy: ABSENT
Bacterial vaginitis: NEGATIVE
CHLAMYDIA, DNA PROBE: NEGATIVE
Candida vaginitis: NEGATIVE
DIAGNOSIS: NEGATIVE
NEISSERIA GONORRHEA: NEGATIVE
Trichomonas: NEGATIVE

## 2017-04-05 ENCOUNTER — Ambulatory Visit (INDEPENDENT_AMBULATORY_CARE_PROVIDER_SITE_OTHER): Admitting: General Practice

## 2017-04-05 DIAGNOSIS — Z111 Encounter for screening for respiratory tuberculosis: Secondary | ICD-10-CM | POA: Diagnosis not present

## 2017-04-07 ENCOUNTER — Encounter: Payer: Self-pay | Admitting: General Practice

## 2017-04-07 LAB — TB SKIN TEST
INDURATION: 0 mm
TB SKIN TEST: NEGATIVE

## 2017-09-09 ENCOUNTER — Ambulatory Visit (INDEPENDENT_AMBULATORY_CARE_PROVIDER_SITE_OTHER): Admitting: *Deleted

## 2017-09-09 DIAGNOSIS — Z23 Encounter for immunization: Secondary | ICD-10-CM | POA: Diagnosis not present

## 2017-10-26 ENCOUNTER — Telehealth: Payer: Self-pay | Admitting: Nurse Practitioner

## 2017-10-26 DIAGNOSIS — L309 Dermatitis, unspecified: Secondary | ICD-10-CM

## 2017-10-26 NOTE — Telephone Encounter (Signed)
Copied from CRM [id number]. Topic: Inquiry >> Oct 26, 2017  4:23 PM Alexander BergeronBarksdale, Harvey B wrote: Reason for CRM: pt called to see if she can have a refill of triamcinolone ointment Rx if so [facility] [address], contact pt if she is needed to come in for an appt

## 2017-10-27 MED ORDER — TRIAMCINOLONE ACETONIDE 0.5 % EX OINT: 1.0000 "application " | TOPICAL_OINTMENT | Freq: Two times a day (BID) | CUTANEOUS | 1 refills | Status: DC

## 2017-10-27 NOTE — Telephone Encounter (Signed)
[facility] as [facility] requested.

## 2018-04-24 ENCOUNTER — Ambulatory Visit

## 2018-04-24 ENCOUNTER — Ambulatory Visit (INDEPENDENT_AMBULATORY_CARE_PROVIDER_SITE_OTHER)

## 2018-04-24 ENCOUNTER — Other Ambulatory Visit: Payer: Self-pay | Admitting: Family Medicine

## 2018-04-24 ENCOUNTER — Encounter: Admitting: Family Medicine

## 2018-04-24 ENCOUNTER — Ambulatory Visit: Admitting: Family Medicine

## 2018-04-24 VITALS — HR 78 | Temp 98.3°F

## 2018-04-24 DIAGNOSIS — Z0184 Encounter for antibody response examination: Secondary | ICD-10-CM

## 2018-04-24 DIAGNOSIS — Z111 Encounter for screening for respiratory tuberculosis: Secondary | ICD-10-CM

## 2018-04-24 DIAGNOSIS — Z23 Encounter for immunization: Secondary | ICD-10-CM

## 2018-04-24 NOTE — Progress Notes (Signed)
Pt was given meningiococcal vaccine in left deltoid. Pt tolerated well. Observed for no rxn. TLG

## 2018-04-25 NOTE — Progress Notes (Signed)
This encounter was created in error - please disregard.

## 2018-04-26 LAB — QUANTIFERON-TB GOLD PLUS
Mitogen-NIL: >10 IU/mL
NIL: 0.05 IU/mL
QUANTIFERON-TB GOLD PLUS: NEGATIVE
TB1-NIL: 0 [IU]/mL
TB2-NIL: <0 IU/mL

## 2018-04-26 LAB — HEPATITIS B SURFACE ANTIBODY,QUALITATIVE: Hep B S Ab: REACTIVE — AB

## 2018-04-26 LAB — VARICELLA ZOSTER ANTIBODY, IGG: VARICELLA IGG: 955.9 {index}

## 2018-05-01 ENCOUNTER — Telehealth: Payer: Self-pay | Admitting: Nurse Practitioner

## 2018-05-01 ENCOUNTER — Other Ambulatory Visit: Payer: Self-pay | Admitting: Family Medicine

## 2018-05-01 DIAGNOSIS — Z0184 Encounter for antibody response examination: Secondary | ICD-10-CM

## 2018-05-01 NOTE — Telephone Encounter (Signed)
RN spoke with Dr. Dayton MartesAron, as well as other clinical staff (RNs, phlebotomists) & was informed that we do not administer the IPV (Polio) vaccine in office or draw the lab titer. Also, followed up with the health department regarding the vaccine and titer & was told that they only administer the vaccine to individuals who are 33 years of age & younger.

## 2018-05-01 NOTE — Telephone Encounter (Signed)
Left vm for the pt to call back. Need to inform the pt of message below. [facility] (she will have to call and check with them). Ok for triage to give message. CRM

## 2018-05-01 NOTE — Telephone Encounter (Signed)
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 Pt can stop by the lab anytime.   Our lab verified [facility] can do it.

## 2018-05-01 NOTE — Telephone Encounter (Signed)
Pt stated she needs this for school, they told her she had 2 in the past and they told her to get a titer done. Please help, she said [facility] told her they could do it but never call her back (do not see note). Please help.    Copied from CRM [id number]. Topic: General - Other >> Apr 28, 2018  4:34 PM Darletta MollLander, Lumin L wrote: Reason for CRM: Polio titer is needed and patient needs to know if it's available so she can schedule.

## 2018-05-01 NOTE — Telephone Encounter (Signed)
Initially suggested completing vaccine series through health department however it appears that this is not available through health department.  Polio titers appear to be available [facility], will order.  Please have her come in for lab visit.

## 2018-05-01 NOTE — Telephone Encounter (Signed)
Pt called back and advised of the message. Pt states others at her school have gotten the polio titer, she does not understand why she is having trouble.

## 2018-05-03 ENCOUNTER — Other Ambulatory Visit

## 2018-05-09 LAB — POLIOVIRUS ANTIBODIES, TYPES 1, 2, AND 3
Poliovirus Ab Type 1: >=1:8 {titer}
Poliovirus Ab Type 3: >=1:8 {titer}

## 2018-05-15 NOTE — Progress Notes (Signed)
Titers show immunity to polio.

## 2018-06-12 ENCOUNTER — Other Ambulatory Visit: Payer: Self-pay | Admitting: Nurse Practitioner

## 2018-06-12 MED ORDER — TRIAMCINOLONE ACETONIDE 0.5 % EX OINT: 1.0000 "application " | TOPICAL_OINTMENT | Freq: Two times a day (BID) | CUTANEOUS | 0 refills | Status: DC

## 2018-06-12 NOTE — Telephone Encounter (Signed)
Copied from CRM [phone]. Topic: Quick Communication - Rx Refill/Question >> Jun 12, 2018  9:55 AM Lenoria ChimeBeasley, Denise S wrote: Medication triamcinolone ointment (KENALOG) 0.5 %  Has the patient contacted their pharmacy? Yes.   (Agent: If no, request that the patient contact the pharmacy for the refill.) (Agent: If yes, when and what did the pharmacy advise?)  Preferred Pharmacy (with phone number or street name): [id number] [place], [place] - [address]  Agent: Please be advised that RX refills may take up to 3 business days. We ask that you follow-up with your pharmacy.

## 2018-06-12 NOTE — Telephone Encounter (Signed)
Triamcinolone ointment (Kenalog) 0.5% refill Last Refill: 10/27/17 Last OV: 12/15/16 PCP: Ceasar Lundharlotte Nche,NP [facility]

## 2018-09-06 ENCOUNTER — Ambulatory Visit (INDEPENDENT_AMBULATORY_CARE_PROVIDER_SITE_OTHER)

## 2018-09-06 DIAGNOSIS — Z23 Encounter for immunization: Secondary | ICD-10-CM | POA: Diagnosis not present

## 2018-09-06 NOTE — Progress Notes (Signed)
Patient came into the office to receive her flu vaccine. Patient received the vaccine in her left deltoid & tolerated the injection well. No signs/symptoms of a reaction prior to her leaving the exam room. Proof of flu vaccination & VIS given to patient.

## 2019-03-08 ENCOUNTER — Ambulatory Visit: Payer: Self-pay | Admitting: Nurse Practitioner

## 2019-03-08 NOTE — Telephone Encounter (Signed)
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    I was wondering if I needed to be tested for the coronavirus.   She denies having symptoms, "I was just wondering".    I let her know they were only testing people in the ED who were being admitted to the hospital at this time.    I suggested she check with her employer to see if it is something they are offering to their employees.   She thanked me for my help and said she would check with her employer.   Reason for Disposition . General information question, no triage required and triager able to answer question  Answer Assessment - Initial Assessment Questions 1. REASON FOR CALL or QUESTION: "What is your reason for calling today?" or "How can I best help you?" or "What question do you have that I can help answer?"     See documentation.   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   She denies having any symptoms, just wondering.  Protocols used: INFORMATION ONLY CALL-A-AH

## 2019-12-24 ENCOUNTER — Other Ambulatory Visit: Payer: Self-pay

## 2019-12-24 ENCOUNTER — Encounter: Payer: Self-pay | Admitting: Nurse Practitioner

## 2019-12-24 ENCOUNTER — Ambulatory Visit (INDEPENDENT_AMBULATORY_CARE_PROVIDER_SITE_OTHER): Admitting: Nurse Practitioner

## 2019-12-24 VITALS — BP 112/82 | HR 85 | Temp 97.3°F | Ht 63.0 in | Wt 206.8 lb

## 2019-12-24 DIAGNOSIS — Z13220 Encounter for screening for lipoid disorders: Secondary | ICD-10-CM | POA: Diagnosis not present

## 2019-12-24 DIAGNOSIS — Z136 Encounter for screening for cardiovascular disorders: Secondary | ICD-10-CM | POA: Diagnosis not present

## 2019-12-24 DIAGNOSIS — Z0000 Encounter for general adult medical examination without abnormal findings: Secondary | ICD-10-CM

## 2019-12-24 DIAGNOSIS — L2082 Flexural eczema: Secondary | ICD-10-CM | POA: Diagnosis not present

## 2019-12-24 LAB — COMPREHENSIVE METABOLIC PANEL
ALT: 24 U/L (ref 0–35)
AST: 20 U/L (ref 0–37)
Albumin: 4.1 g/dL (ref 3.5–5.2)
Alkaline Phosphatase: 45 U/L (ref 39–117)
BUN: 13 mg/dL (ref 6–23)
CO2: 26 mEq/L (ref 19–32)
Calcium: 9.4 mg/dL (ref 8.4–10.5)
Chloride: 105 mEq/L (ref 96–112)
Creatinine, Ser: 0.89 mg/dL (ref 0.40–1.20)
GFR: 87.78 mL/min (ref >60.00)
Glucose, Bld: 85 mg/dL (ref 70–99)
Potassium: 4.2 mEq/L (ref 3.5–5.1)
Sodium: 138 mEq/L (ref 135–145)
Total Bilirubin: 0.5 mg/dL (ref 0.2–1.2)
Total Protein: 7.5 g/dL (ref 6.0–8.3)

## 2019-12-24 LAB — LIPID PANEL
Cholesterol: 182 mg/dL (ref 0–200)
HDL: 45.1 mg/dL (ref >39.00)
LDL Cholesterol: 116 mg/dL — ABNORMAL HIGH (ref 0–99)
NonHDL: 137.33
Total CHOL/HDL Ratio: 4
Triglycerides: 108 mg/dL (ref 0.0–149.0)
VLDL: 21.6 mg/dL (ref 0.0–40.0)

## 2019-12-24 LAB — CBC
HCT: 38.3 % (ref 36.0–46.0)
Hemoglobin: 12.7 g/dL (ref 12.0–15.0)
MCHC: 33.1 g/dL (ref 30.0–36.0)
MCV: 90.4 fl (ref 78.0–100.0)
Platelets: 281 10*3/uL (ref 150.0–400.0)
RBC: 4.24 Mil/uL (ref 3.87–5.11)
RDW: 13 % (ref 11.5–15.5)
WBC: 5.2 10*3/uL (ref 4.0–10.5)

## 2019-12-24 LAB — TSH: TSH: 2.53 u[IU]/mL (ref 0.35–4.50)

## 2019-12-24 MED ORDER — TRIAMCINOLONE ACETONIDE 0.5 % EX OINT: 1.0000 "application " | TOPICAL_OINTMENT | Freq: Two times a day (BID) | CUTANEOUS | 2 refills | Status: DC

## 2019-12-24 NOTE — Progress Notes (Signed)
Subjective:    Patient ID: Ashley Mcdowell, female    DOB: Dec 19, 1985, 34 y.o.   MRN: [id number]  Patient presents today for complete physical  HPI  Sexual History (orientation,birth control, marital status, STD):married, female partner, husband with vasectomy, has 2 children.  Depression/Suicide: Depression screen [facility] 2/9 12/24/2019 12/15/2016  Decreased Interest 0 0  Down, Depressed, Hopeless 0 0  PHQ - 2 Score 0 0   Vision:up to date  Dental:up to date  Immunizations: (TDAP, Hep C screen, Pneumovax, Influenza, zoster)  Health Maintenance  Topic Date Due  . Flu Shot  06/16/2019  . Pap Smear  12/16/2019  . HIV Screening  12/23/2020*  . Tetanus Vaccine  05/15/2024  *Topic was postponed. The date shown is not the original due date.   Diet:regular.  Weight:  Wt Readings from Last 3 Encounters:  12/24/19 206 lb 12.8 oz (93.8 kg)  12/15/16 197 lb (89.4 kg)  07/04/14 160 lb (72.6 kg)   Exercise:none  Fall Risk: Fall Risk  12/24/2019 12/15/2016  Falls in the past year? 0 No   Medications and allergies reviewed with patient and updated if appropriate.  Patient Active Problem List   Diagnosis Date Noted  . Flexural eczema 12/15/2016    No current outpatient medications on file prior to visit.   No current facility-administered medications on file prior to visit.    No past medical history on file.  No past surgical history on file.  Social History   Socioeconomic History  . Marital status: Married    Spouse name: Not on file  . Number of children: Not on file  . Years of education: Not on file  . Highest education level: Not on file  Occupational History  . Not on file  Tobacco Use  . Smoking status: Never Smoker  . Smokeless tobacco: Never Used  Substance and Sexual Activity  . Alcohol use: Not Currently  . Drug use: No  . Sexual activity: Yes    Birth control/protection: None, Other-see comments, Surgical    Comment: husband has vasectomy  Other  Topics Concern  . Not on file  Social History Narrative  . Not on file   Social Determinants of Health   Financial Resource Strain:   . Difficulty of Paying Living Expenses: Not on file  Food Insecurity:   . Worried About Charity fundraiser in the Last Year: Not on file  . Ran Out of Food in the Last Year: Not on file  Transportation Needs:   . Lack of Transportation (Medical): Not on file  . Lack of Transportation (Non-Medical): Not on file  Physical Activity:   . Days of Exercise per Week: Not on file  . Minutes of Exercise per Session: Not on file  Stress:   . Feeling of Stress : Not on file  Social Connections:   . Frequency of Communication with Friends and Family: Not on file  . Frequency of Social Gatherings with Friends and Family: Not on file  . Attends Religious Services: Not on file  . Active Member of Clubs or Organizations: Not on file  . Attends Archivist Meetings: Not on file  . Marital Status: Not on file    Family History  Problem Relation Age of Onset  . Hypertension Mother   . Hyperlipidemia Father   . Arthritis Father 50       rheumatoid  . Hyperlipidemia Paternal Grandmother   . Arthritis Paternal Grandmother  rheumatoid        Review of Systems  Constitutional: Negative for fever, malaise/fatigue and weight loss.  HENT: Negative for congestion and sore throat.   Eyes:       Negative for visual changes  Respiratory: Negative for cough and shortness of breath.   Cardiovascular: Negative for chest pain, palpitations and leg swelling.  Gastrointestinal: Negative for blood in stool, constipation, diarrhea and heartburn.  Genitourinary: Negative for dysuria, frequency and urgency.  Musculoskeletal: Negative for falls, joint pain and myalgias.  Skin: Positive for itching and rash.  Neurological: Negative for dizziness, sensory change and headaches.  Endo/Heme/Allergies: Does not bruise/bleed easily.  Psychiatric/Behavioral:  Negative for depression, substance abuse and suicidal ideas. The patient is not nervous/anxious.     Objective:   Vitals:   12/24/19 0905  BP: 112/82  Pulse: 85  Temp: (!) 97.3 F (36.3 C)  SpO2: 100%    Body mass index is 36.63 kg/m.  Physical Examination:  Physical Exam Vitals reviewed.  Constitutional:      General: She is not in acute distress.    Appearance: She is well-developed.  HENT:     Right Ear: Tympanic membrane, ear canal and external ear normal.     Left Ear: Ear canal and external ear normal.  Eyes:     Extraocular Movements: Extraocular movements intact.     Conjunctiva/sclera: Conjunctivae normal.  Cardiovascular:     Rate and Rhythm: Normal rate and regular rhythm.     Pulses: Normal pulses.     Heart sounds: Normal heart sounds.  Pulmonary:     Effort: Pulmonary effort is normal. No respiratory distress.     Breath sounds: Normal breath sounds.  Chest:     Chest wall: No tenderness.  Abdominal:     General: Bowel sounds are normal.     Palpations: Abdomen is soft.  Genitourinary:    Comments: Breast and pelvic exam deferred to GYN per patient Musculoskeletal:        General: Normal range of motion.     Cervical back: Normal range of motion and neck supple.  Skin:    General: Skin is warm and dry.     Findings: Rash present. No erythema.  Neurological:     Mental Status: She is alert and oriented to person, place, and time.     Deep Tendon Reflexes: Reflexes are normal and symmetric.  Psychiatric:        Mood and Affect: Mood normal.        Behavior: Behavior normal.        Thought Content: Thought content normal.    ASSESSMENT and PLAN: This visit occurred during the SARS-CoV-2 public health emergency.  Safety protocols were in place, including screening questions prior to the visit, additional usage of staff PPE, and extensive cleaning of exam room while observing appropriate contact time as indicated for disinfecting solutions.    Ashley Mcdowell was seen today for annual exam.  Diagnoses and all orders for this visit:  Preventative health care -     CBC -     Comprehensive metabolic panel -     TSH -     Lipid panel  Encounter for lipid screening for cardiovascular disease -     Lipid panel  Flexural eczema -     triamcinolone ointment (KENALOG) 0.5 %; Apply 1 application topically 2 (two) times daily.   No problem-specific Assessment & Plan notes found for this encounter.     Problem List  Items Addressed This Visit      Musculoskeletal and Integument   Flexural eczema   Relevant Medications   triamcinolone ointment (KENALOG) 0.5 %    Other Visit Diagnoses    Preventative health care    -  Primary   Relevant Orders   CBC (Completed)   Comprehensive metabolic panel (Completed)   TSH (Completed)   Lipid panel (Completed)   Encounter for lipid screening for cardiovascular disease       Relevant Orders   Lipid panel (Completed)       Follow up: Return in about 1 year (around 12/23/2020) for CPE (fasting).  Alysia Penna, NP

## 2019-12-24 NOTE — Patient Instructions (Addendum)
Go to lab for blood draw.  Continue exercise regimen and start DASH diet.  May use Cerave or Eucerin or Cetaphil cream for eczema.  Preventive Care 22-35 Years Old, Female Preventive care refers to visits with your health care provider and lifestyle choices that can promote health and wellness. This includes:  A yearly physical exam. This may also be called an annual well check.  Regular dental visits and eye exams.  Immunizations.  Screening for certain conditions.  Healthy lifestyle choices, such as eating a healthy diet, getting regular exercise, not using drugs or products that contain nicotine and tobacco, and limiting alcohol use. What can I expect for my preventive care visit? Physical exam Your health care provider will check your:  Height and weight. This may be used to calculate body mass index (BMI), which tells if you are at a healthy weight.  Heart rate and blood pressure.  Skin for abnormal spots. Counseling Your health care provider may ask you questions about your:  Alcohol, tobacco, and drug use.  Emotional well-being.  Home and relationship well-being.  Sexual activity.  Eating habits.  Work and work Statistician.  Method of birth control.  Menstrual cycle.  Pregnancy history. What immunizations do I need?  Influenza (flu) vaccine  This is recommended every year. Tetanus, diphtheria, and pertussis (Tdap) vaccine  You may need a Td booster every 10 years. Varicella (chickenpox) vaccine  You may need this if you have not been vaccinated. Human papillomavirus (HPV) vaccine  If recommended by your health care provider, you may need three doses over 6 months. Measles, mumps, and rubella (MMR) vaccine  You may need at least one dose of MMR. You may also need a second dose. Meningococcal conjugate (MenACWY) vaccine  One dose is recommended if you are age 100-21 years and a first-year college student living in a residence hall, or if you have  one of several medical conditions. You may also need additional booster doses. Pneumococcal conjugate (PCV13) vaccine  You may need this if you have certain conditions and were not previously vaccinated. Pneumococcal polysaccharide (PPSV23) vaccine  You may need one or two doses if you smoke cigarettes or if you have certain conditions. Hepatitis A vaccine  You may need this if you have certain conditions or if you travel or work in places where you may be exposed to hepatitis A. Hepatitis B vaccine  You may need this if you have certain conditions or if you travel or work in places where you may be exposed to hepatitis B. Haemophilus influenzae type b (Hib) vaccine  You may need this if you have certain conditions. You may receive vaccines as individual doses or as more than one vaccine together in one shot (combination vaccines). Talk with your health care provider about the risks and benefits of combination vaccines. What tests do I need?  Blood tests  Lipid and cholesterol levels. These may be checked every 5 years starting at age 49.  Hepatitis C test.  Hepatitis B test. Screening  Diabetes screening. This is done by checking your blood sugar (glucose) after you have not eaten for a while (fasting).  Sexually transmitted disease (STD) testing.  BRCA-related cancer screening. This may be done if you have a family history of breast, ovarian, tubal, or peritoneal cancers.  Pelvic exam and Pap test. This may be done every 3 years starting at age 53. Starting at age 18, this may be done every 5 years if you have a Pap test  in combination with an HPV test. Talk with your health care provider about your test results, treatment options, and if necessary, the need for more tests. Follow these instructions at home: Eating and drinking   Eat a diet that includes fresh fruits and vegetables, whole grains, lean protein, and low-fat dairy.  Take vitamin and mineral supplements as  recommended by your health care provider.  Do not drink alcohol if: ? Your health care provider tells you not to drink. ? You are pregnant, may be pregnant, or are planning to become pregnant.  If you drink alcohol: ? Limit how much you have to 0-1 drink a day. ? Be aware of how much alcohol is in your drink. In the [place] one drink equals one 12 oz bottle of beer (355 mL), one 5 oz glass of wine (148 mL), or one 1 oz glass of hard liquor (44 mL). Lifestyle  Take daily care of your teeth and gums.  Stay active. Exercise for at least 30 minutes on 5 or more days each week.  Do not use any products that contain nicotine or tobacco, such as cigarettes, e-cigarettes, and chewing tobacco. If you need help quitting, ask your health care provider.  If you are sexually active, practice safe sex. Use a condom or other form of birth control (contraception) in order to prevent pregnancy and STIs (sexually transmitted infections). If you plan to become pregnant, see your health care provider for a preconception visit. What's next?  Visit your health care provider once a year for a well check visit.  Ask your health care provider how often you should have your eyes and teeth checked.  Stay up to date on all vaccines. This information is not intended to replace advice given to you by your health care provider. Make sure you discuss any questions you have with your health care provider. Document Revised: 07/13/2018 Document Reviewed: 07/13/2018 Elsevier Patient Education  2020 [employer].

## 2019-12-25 ENCOUNTER — Telehealth: Payer: Self-pay | Admitting: Nurse Practitioner

## 2019-12-25 NOTE — Telephone Encounter (Signed)
Left vm for the pt to call back. See lab result.

## 2019-12-25 NOTE — Telephone Encounter (Signed)
Pt calling back, please call back [phone], pt wont be able to answer after 2

## 2020-03-13 ENCOUNTER — Encounter: Payer: Self-pay | Admitting: Nurse Practitioner

## 2020-03-13 ENCOUNTER — Telehealth (INDEPENDENT_AMBULATORY_CARE_PROVIDER_SITE_OTHER): Admitting: Nurse Practitioner

## 2020-03-13 ENCOUNTER — Other Ambulatory Visit: Payer: Self-pay

## 2020-03-13 VITALS — HR 76 | Ht 63.0 in | Wt 174.0 lb

## 2020-03-13 DIAGNOSIS — S39012A Strain of muscle, fascia and tendon of lower back, initial encounter: Secondary | ICD-10-CM | POA: Diagnosis not present

## 2020-03-13 MED ORDER — CYCLOBENZAPRINE HCL 5 MG PO TABS: 5.0000 mg | ORAL_TABLET | Freq: Every day | ORAL | 0 refills | Status: DC

## 2020-03-13 MED ORDER — IBUPROFEN 600 MG PO TABS: 600.0000 mg | ORAL_TABLET | Freq: Three times a day (TID) | ORAL | 0 refills | Status: DC

## 2020-03-13 NOTE — Patient Instructions (Signed)
Avoid weight training for 1-2weeks. Ok to continue walking and use of stationery bike (stretch before and after exercise). Use cold compress as needed (47mns every 2hrs) Call office if not improvement in 1week   Back Injury Prevention Back injuries can be very painful. They can also be difficult to heal. After having one back injury, you are more likely to have another one again. It is important to learn how to avoid injuring or re-injuring your back. The following tips can help you to prevent a back injury. What actions can I take to prevent back injuries? Changes in your diet Talk with your doctor about what to eat. Some foods can make the bones strong.  Talk with your doctor about how much calcium and vitamin D you need each day. These nutrients help to prevent weakening of the bones (osteoporosis).  Eat foods that have calcium. These include: ? Dairy products. ? Green leafy vegetables. ? Food and drinks that have had calcium added to them (fortified).  Eat foods that have vitamin D. These include: ? Milk. ? Food and drinks that have had vitamin D added to them.  Take other supplements and vitamins only as told by your doctor. Physical fitness Physical fitness makes your bones and muscles strong. It also improves your balance and strength.  Exercise for 30 minutes per day on most days of the week, or as told by your doctor. Make sure to: ? Do aerobic exercises, such as walking, jogging, biking, or swimming. ? Do exercises that increase balance and strength, such as tai chi and yoga. ? Do stretching exercises. This helps with flexibility. ? Develop strong belly (abdominal) muscles. Your belly muscles help to support your back.  Stay at a healthy weight. This lowers your risk of a back injury. Good posture        Prevent back injuries by developing and maintaining a good posture. To do this:  Sit up straight and stand up straight. Avoid leaning forward when you sit or  hunching over when you stand.  Choose chairs that have good low-back (lumbar) support.  If you work at a desk: ? Sit close to it so you do not need to lean over. ? Keep your chin tucked in. ? Keep your neck drawn back. ? Keep your elbows bent so that your arms make a corner (right angle).  When you drive: ? Sit high and close to the steering wheel. Add a low-back support to your car seat, if needed. ? Take breaks every hour if you are driving for long periods of time.  Avoid sitting or standing in one position for very long. Take breaks to get up, stretch, and walk around at least once every hour.  Sleep on your side with your knees slightly bent, or sleep on your back with a pillow under your knees.  Lifting, twisting, and reaching   Heavy lifting ? Avoid heavy lifting, especially lifting over and over again. If you must do heavy lifting:  Stretch before lifting.  Work slowly.  Rest between lifts.  Use a tool such as a cart or a dolly to move objects if one is available.  Make several small trips instead of carrying one heavy load.  Ask for help when you need it, especially when moving big objects. ? Follow these steps when lifting:  Stand with your feet shoulder-width apart.  Get as close to the object as you can. Do not pick up a heavy object that is far from your  body.  Use handles or lifting straps if they are available.  Bend at your knees. Squat down, but keep your heels off the floor.  Keep your shoulders back. Keep your chin tucked in. Keep your back straight.  Lift the object slowly while you tighten the muscles in your legs, belly, and bottom. Keep the object as close to the center of your body as possible. ? Follow these steps when putting down a heavy load:  Stand with your feet shoulder-width apart.  Lower the object slowly while you tighten the muscles in your legs, belly, and bottom. Keep the object as close to the center of your body as  possible.  Keep your shoulders back. Keep your chin tucked in. Keep your back straight.  Bend at your knees. Squat down, but keep your heels off the floor.  Use handles or lifting straps if they are available.  Twisting and reaching ? Avoid lifting heavy objects above your waist. ? Do not twist at your waist while you are lifting or carrying a load. If you need to turn, move your feet. ? Do not bend over without bending at your knees. ? Avoid reaching over your head, across a table, or for an object on a high surface. Other things to do   Avoid wet floors and icy ground. Keep sidewalks clear of ice to prevent falls.  Do not sleep on a mattress that is too soft or too hard.  Store heavier objects on shelves at waist level.  Store lighter objects on lower or higher shelves.  Find ways to lower your stress, such as: ? Exercise. ? Massage. ? Relaxation techniques.  Talk with your doctor if you feel anxious or depressed. These conditions can make back pain worse.  Wear flat heel shoes with cushioned soles.  Use both shoulder straps when carrying a backpack.  Do not use any products that contain nicotine or tobacco, such as cigarettes and e-cigarettes. If you need help quitting, ask your doctor. Summary  Back injuries can be very painful and difficult to heal.  You can keep your back healthy by making certain changes. These include eating foods that make bones strong, working on being physically fit, developing a good posture, and lifting heavy objects in a safe way. This information is not intended to replace advice given to you by your health care provider. Make sure you discuss any questions you have with your health care provider. Document Revised: 07/25/2019 Document Reviewed: 12/23/2017 Elsevier Patient Education  [place].

## 2020-03-13 NOTE — Progress Notes (Signed)
Virtual Visit via Video Note  I connected with@ on 03/13/20 at  1:30 PM EDT by a video enabled telemedicine application and verified that I am speaking with the correct person using two identifiers.  Location: Patient:Home Provider: Office Participants: patient and provider  I discussed the limitations of evaluation and management by telemedicine and the availability of in person appointments. I also discussed with the patient that there may be a patient responsible charge related to this service. The patient expressed understanding and agreed to proceed.  IR:SWNIO back pain-hurts more when sitting than standing//pain started 3 weeks ago//pt tried icy hot and ibuprofen but no relief..pt reported going to the gym and thought she pulled somthing around the time pain started  History of Present Illness: Back Pain This is a new problem. The current episode started in the past 7 days. The problem occurs intermittently. The problem is unchanged. The pain is present in the lumbar spine. The pain does not radiate. The pain is worse during the night. The symptoms are aggravated by sitting. Pertinent negatives include no abdominal pain, bladder incontinence, bowel incontinence, leg pain, numbness, paresis, paresthesias, pelvic pain, perianal numbness, tingling or weakness. Risk factors include poor posture and obesity (onset after weight training routine). She has tried NSAIDs and ice for the symptoms. The treatment provided mild relief.   Observations/Objective: Physical Exam  Constitutional: She is oriented to person, place, and time. No distress.  Pulmonary/Chest: Effort normal.  Musculoskeletal:        General: Tenderness present. No deformity.     Cervical back: Normal range of motion and neck supple.     Lumbar back: Tenderness present. No bony tenderness.     Comments: Normal gait Lower paraspinal lumbar muscle tenderness.  Neurological: She is alert and oriented to person, place, and time.   Skin: No rash noted.   Assessment and Plan: Khyleigh was seen today for back pain.  Diagnoses and all orders for this visit:  Lumbar spine strain, initial encounter -     ibuprofen (ADVIL) 600 MG tablet; Take 1 tablet (600 mg total) by mouth 3 (three) times daily. With food -     cyclobenzaprine (FLEXERIL) 5 MG tablet; Take 1-2 tablets (5-10 mg total) by mouth at bedtime.   Follow Up Instructions: Avoid weight training for 1-2weeks. Ok to continue walking and use of stationery bike (stretch before and after exercise). Use cold compress as needed ( every 2hrs) Call office if not improvement in 1week   I discussed the assessment and treatment plan with the patient. The patient was provided an opportunity to ask questions and all were answered. The patient agreed with the plan and demonstrated an understanding of the instructions.   The patient was advised to call back or seek an in-person evaluation if the symptoms worsen or if the condition fails to improve as anticipated.  Alysia Penna, NP

## 2020-12-24 ENCOUNTER — Encounter: Admitting: Nurse Practitioner

## 2022-08-07 ENCOUNTER — Telehealth: Payer: Self-pay | Admitting: Nurse Practitioner

## 2022-08-07 DIAGNOSIS — R399 Unspecified symptoms and signs involving the genitourinary system: Secondary | ICD-10-CM

## 2022-08-07 MED ORDER — CEPHALEXIN 500 MG PO CAPS: 500.0000 mg | ORAL_CAPSULE | Freq: Two times a day (BID) | ORAL | 0 refills | Status: DC

## 2022-08-07 NOTE — Progress Notes (Signed)

## 2022-08-09 ENCOUNTER — Telehealth: Payer: Self-pay | Admitting: Nurse Practitioner

## 2022-08-09 NOTE — Telephone Encounter (Signed)
Error

## 2022-08-12 ENCOUNTER — Ambulatory Visit: Admitting: Family Medicine

## 2022-08-18 ENCOUNTER — Inpatient Hospital Stay: Payer: Self-pay | Admitting: Nurse Practitioner

## 2022-08-20 ENCOUNTER — Ambulatory Visit: Payer: Self-pay | Admitting: Nurse Practitioner

## 2022-09-07 ENCOUNTER — Encounter: Payer: Self-pay | Admitting: Nurse Practitioner

## 2022-10-19 ENCOUNTER — Ambulatory Visit (INDEPENDENT_AMBULATORY_CARE_PROVIDER_SITE_OTHER): Admitting: Nurse Practitioner

## 2022-10-19 ENCOUNTER — Encounter: Payer: Self-pay | Admitting: Nurse Practitioner

## 2022-10-19 ENCOUNTER — Other Ambulatory Visit (HOSPITAL_COMMUNITY)
Admission: RE | Admit: 2022-10-19 | Discharge: 2022-10-19 | Disposition: A | Discharge status: Home / Self Care | Source: Ambulatory Visit | Attending: Nurse Practitioner | Admitting: Nurse Practitioner

## 2022-10-19 VITALS — BP 124/80 | HR 85 | Temp 97.4°F | Ht 63.0 in | Wt 205.6 lb

## 2022-10-19 DIAGNOSIS — Z124 Encounter for screening for malignant neoplasm of cervix: Secondary | ICD-10-CM | POA: Insufficient documentation

## 2022-10-19 DIAGNOSIS — Z23 Encounter for immunization: Secondary | ICD-10-CM

## 2022-10-19 DIAGNOSIS — Z13220 Encounter for screening for lipoid disorders: Secondary | ICD-10-CM | POA: Diagnosis not present

## 2022-10-19 DIAGNOSIS — Z0000 Encounter for general adult medical examination without abnormal findings: Secondary | ICD-10-CM | POA: Diagnosis not present

## 2022-10-19 DIAGNOSIS — Z136 Encounter for screening for cardiovascular disorders: Secondary | ICD-10-CM | POA: Diagnosis not present

## 2022-10-19 DIAGNOSIS — L2082 Flexural eczema: Secondary | ICD-10-CM

## 2022-10-19 LAB — COMPREHENSIVE METABOLIC PANEL
ALT: 28 U/L (ref 0–35)
AST: 22 U/L (ref 0–37)
Albumin: 4.1 g/dL (ref 3.5–5.2)
Alkaline Phosphatase: 51 U/L (ref 39–117)
BUN: 11 mg/dL (ref 6–23)
CO2: 26 mEq/L (ref 19–32)
Calcium: 9.2 mg/dL (ref 8.4–10.5)
Chloride: 101 mEq/L (ref 96–112)
Creatinine, Ser: 0.81 mg/dL (ref 0.40–1.20)
GFR: 93.03 mL/min (ref >60.00)
Glucose, Bld: 80 mg/dL (ref 70–99)
Potassium: 3.8 mEq/L (ref 3.5–5.1)
Sodium: 135 mEq/L (ref 135–145)
Total Bilirubin: 0.4 mg/dL (ref 0.2–1.2)
Total Protein: 7.5 g/dL (ref 6.0–8.3)

## 2022-10-19 LAB — LIPID PANEL
Cholesterol: 156 mg/dL (ref 0–200)
HDL: 52.1 mg/dL (ref >39.00)
LDL Cholesterol: 87 mg/dL (ref 0–99)
NonHDL: 104.36
Total CHOL/HDL Ratio: 3
Triglycerides: 87 mg/dL (ref 0.0–149.0)
VLDL: 17.4 mg/dL (ref 0.0–40.0)

## 2022-10-19 MED ORDER — EUCRISA 2 % EX OINT: 1.0000 | TOPICAL_OINTMENT | Freq: Every day | CUTANEOUS | 0 refills | Status: DC

## 2022-10-19 MED ORDER — TRIAMCINOLONE ACETONIDE 0.5 % EX OINT: 1.0000 | TOPICAL_OINTMENT | Freq: Two times a day (BID) | CUTANEOUS | 2 refills | Status: DC

## 2022-10-19 NOTE — Patient Instructions (Addendum)
Go to lab for blood draw.  Preventive Care 37-37 Years Old, Female Preventive care refers to lifestyle choices and visits with your health care provider that can promote health and wellness. Preventive care visits are also called wellness exams. What can I expect for my preventive care visit? Counseling During your preventive care visit, your health care provider may ask about your: Medical history, including: Past medical problems. Family medical history. Pregnancy history. Current health, including: Menstrual cycle. Method of birth control. Emotional well-being. Home life and relationship well-being. Sexual activity and sexual health. Lifestyle, including: Alcohol, nicotine or tobacco, and drug use. Access to firearms. Diet, exercise, and sleep habits. Work and work Statistician. Sunscreen use. Safety issues such as seatbelt and bike helmet use. Physical exam Your health care provider may check your: Height and weight. These may be used to calculate your BMI (body mass index). BMI is a measurement that tells if you are at a healthy weight. Waist circumference. This measures the distance around your waistline. This measurement also tells if you are at a healthy weight and may help predict your risk of certain diseases, such as type 2 diabetes and high blood pressure. Heart rate and blood pressure. Body temperature. Skin for abnormal spots. What immunizations do I need?  Vaccines are usually given at various ages, according to a schedule. Your health care provider will recommend vaccines for you based on your age, medical history, and lifestyle or other factors, such as travel or where you work. What tests do I need? Screening Your health care provider may recommend screening tests for certain conditions. This may include: Pelvic exam and Pap test. Lipid and cholesterol levels. Diabetes screening. This is done by checking your blood sugar (glucose) after you have not eaten for a  while (fasting). Hepatitis B test. Hepatitis C test. HIV (human immunodeficiency virus) test. STI (sexually transmitted infection) testing, if you are at risk. BRCA-related cancer screening. This may be done if you have a family history of breast, ovarian, tubal, or peritoneal cancers. Talk with your health care provider about your test results, treatment options, and if necessary, the need for more tests. Follow these instructions at home: Eating and drinking  Eat a healthy diet that includes fresh fruits and vegetables, whole grains, lean protein, and low-fat dairy products. Take vitamin and mineral supplements as recommended by your health care provider. Do not drink alcohol if: Your health care provider tells you not to drink. You are pregnant, may be pregnant, or are planning to become pregnant. If you drink alcohol: Limit how much you have to 0-1 drink a day. Know how much alcohol is in your drink. In the [place] one drink equals one 12 oz bottle of beer (355 mL), one 5 oz glass of wine (148 mL), or one 1 oz glass of hard liquor (44 mL). Lifestyle Brush your teeth every morning and night with fluoride toothpaste. Floss one time each day. Exercise for at least 30 minutes 5 or more days each week. Do not use any products that contain nicotine or tobacco. These products include cigarettes, chewing tobacco, and vaping devices, such as e-cigarettes. If you need help quitting, ask your health care provider. Do not use drugs. If you are sexually active, practice safe sex. Use a condom or other form of protection to prevent STIs. If you do not wish to become pregnant, use a form of birth control. If you plan to become pregnant, see your health care provider for a prepregnancy visit. Find healthy  ways to manage stress, such as: Meditation, yoga, or listening to music. Journaling. Talking to a trusted person. Spending time with friends and family. Minimize exposure to UV radiation to reduce  your risk of skin cancer. Safety Always wear your seat belt while driving or riding in a vehicle. Do not drive: If you have been drinking alcohol. Do not ride with someone who has been drinking. If you have been using any mind-altering substances or drugs. While texting. When you are tired or distracted. Wear a helmet and other protective equipment during sports activities. If you have firearms in your house, make sure you follow all gun safety procedures. Seek help if you have been physically or sexually abused. What's next? Go to your health care provider once a year for an annual wellness visit. Ask your health care provider how often you should have your eyes and teeth checked. Stay up to date on all vaccines. This information is not intended to replace advice given to you by your health care provider. Make sure you discuss any questions you have with your health care provider. Document Revised: 04/29/2021 Document Reviewed: 04/29/2021 Elsevier Patient Education  [employer].

## 2022-10-19 NOTE — Progress Notes (Signed)
Complete physical exam  Patient: Ashley Mcdowell   DOB: 1986/05/17   36 y.o. Female  MRN: [id number] Visit Date: 10/19/2022  Subjective:    Chief Complaint  Patient presents with   Annual Exam    CPE/ PAP Pt not fasting  Tdap given today    CLARISE CHACKO is a 36 y.o. female who presents today for a complete physical exam. She reports consuming a general diet.  Walking daily  She generally feels well. She reports sleeping well. She does not have additional problems to discuss today.  Vision:No Dental:Yes STD Screen:No  Most recent fall risk assessment:    12/24/2019    9:04 AM  Fall Risk   Falls in the past year? 0     Depression screen:Yes - No Depression  Most recent depression screenings:    12/24/2019    9:04 AM 12/15/2016   11:03 AM  PHQ 2/9 Scores  PHQ - 2 Score 0 0    HPI  No problem-specific Assessment & Plan notes found for this encounter.   History reviewed. No pertinent past medical history. History reviewed. No pertinent surgical history. Social History   Socioeconomic History   Marital status: Married    Spouse name: Not on file   Number of children: Not on file   Years of education: Not on file   Highest education level: Not on file  Occupational History   Not on file  Tobacco Use   Smoking status: Never   Smokeless tobacco: Never  Vaping Use   Vaping Use: Never used  Substance and Sexual Activity   Alcohol use: Not Currently   Drug use: No   Sexual activity: Yes    Birth control/protection: None, Other-see comments, Surgical    Comment: husband has vasectomy  Other Topics Concern   Not on file  Social History Narrative   Not on file   Social Determinants of Health   Financial Resource Strain: Not on file  Food Insecurity: Not on file  Transportation Needs: Not on file  Physical Activity: Not on file  Stress: Not on file  Social Connections: Not on file  Intimate Partner Violence: Not on file   Family Status   Relation Name Status   Mother  Alive   Father  Deceased   Sister  Alive   Daughter  Alive   Son  Alive   PGM  Alive   Paternal GGM  Alive   Family History  Problem Relation Age of Onset   Hypertension Mother    Hyperlipidemia Father    Arthritis Father 41       rheumatoid   Cerebral aneurysm Father 13       secondary to cerebral infection   Hyperlipidemia Paternal Grandmother    Arthritis Paternal Grandmother        rheumatoid   Cancer Paternal Great-grandmother 6       breast   No Known Allergies  Patient Care Team: Taliana Mersereau, Bonna Gains, NP as PCP - General (Internal Medicine)   Medications: Outpatient Medications Prior to Visit  Medication Sig   ibuprofen (ADVIL) 600 MG tablet Take 1 tablet (600 mg total) by mouth 3 (three) times daily. With food   triamcinolone ointment (KENALOG) 0.5 % Apply 1 application topically 2 (two) times daily.   cephALEXin (KEFLEX) 500 MG capsule Take 1 capsule (500 mg total) by mouth 2 (two) times daily. (Patient not taking: Reported on 10/19/2022)   cyclobenzaprine (FLEXERIL) 5 MG tablet Take  1-2 tablets (5-10 mg total) by mouth at bedtime. (Patient not taking: Reported on 10/19/2022)   No facility-administered medications prior to visit.   Review of Systems  Constitutional:  Negative for fever.  HENT:  Negative for congestion and sore throat.   Eyes:        Negative for visual changes  Respiratory:  Negative for cough and shortness of breath.   Cardiovascular:  Negative for chest pain, palpitations and leg swelling.  Gastrointestinal:  Negative for blood in stool, constipation and diarrhea.  Genitourinary:  Negative for dysuria, frequency and urgency.  Musculoskeletal:  Negative for myalgias.  Skin:  Positive for rash.  Neurological:  Negative for dizziness and headaches.  Hematological:  Does not bruise/bleed easily.  Psychiatric/Behavioral:  Negative for suicidal ideas. The patient is not nervous/anxious.        Objective:  BP  124/80 (BP Location: Right Arm, Patient Position: Sitting, Cuff Size: Normal)   Pulse 85   Temp (!) 97.4 F (36.3 C) (Temporal)   Ht 5\' 3"  (1.6 m)   Wt 205 lb 9.6 oz (93.3 kg)   SpO2 99%   BMI 36.42 kg/m     Physical Exam Vitals reviewed. Exam conducted with a chaperone present.  Constitutional:      General: She is not in acute distress.    Appearance: She is obese.  HENT:     Right Ear: Tympanic membrane, ear canal and external ear normal.     Left Ear: Tympanic membrane, ear canal and external ear normal.     Nose: Nose normal.  Eyes:     General: No scleral icterus.    Extraocular Movements: Extraocular movements intact.     Conjunctiva/sclera: Conjunctivae normal.     Pupils: Pupils are equal, round, and reactive to light.  Cardiovascular:     Rate and Rhythm: Normal rate and regular rhythm.     Pulses: Normal pulses.     Heart sounds: Normal heart sounds.  Pulmonary:     Effort: Pulmonary effort is normal. No respiratory distress.     Breath sounds: Normal breath sounds.  Chest:  Breasts:    Breasts are symmetrical.     Right: Normal.     Left: Normal.  Abdominal:     General: Bowel sounds are normal. There is no distension.     Palpations: Abdomen is soft.     Hernia: There is no hernia in the left inguinal area or right inguinal area.  Genitourinary:    General: Normal vulva.     Exam position: Lithotomy position.     Labia:        Right: No rash, tenderness or lesion.        Left: No rash, tenderness or lesion.      Vagina: Vaginal discharge present. No erythema, tenderness or bleeding.     Cervix: No cervical motion tenderness, friability or erythema.     Uterus: Normal.      Adnexa: Right adnexa normal and left adnexa normal.  Musculoskeletal:        General: Normal range of motion.     Cervical back: Normal range of motion and neck supple.     Right lower leg: No edema.     Left lower leg: No edema.  Lymphadenopathy:     Cervical: No cervical  adenopathy.     Upper Body:     Right upper body: No supraclavicular, axillary or pectoral adenopathy.     Left upper body: No supraclavicular,  axillary or pectoral adenopathy.     Lower Body: No right inguinal adenopathy. No left inguinal adenopathy.  Skin:    General: Skin is warm and dry.  Neurological:     Mental Status: She is alert and oriented to person, place, and time.  Psychiatric:        Mood and Affect: Mood normal.        Behavior: Behavior normal.        Thought Content: Thought content normal.     No results found for any visits on 10/19/22.    Assessment & Plan:    Routine Health Maintenance and Physical Exam  Immunization History  Administered Date(s) Administered   Influenza,inj,Quad PF,6+ Mos 12/15/2016, 09/09/2017, 09/06/2018   Influenza-Unspecified 08/29/2020, 09/29/2022   Meningococcal Mcv4o 04/24/2018   PFIZER(Purple Top)SARS-COV-2 Vaccination 12/04/2019   PPD Test 04/05/2017   Health Maintenance  Topic Date Due   DTaP/Tdap/Td (1 - Tdap) Never done   PAP SMEAR-Modifier  12/16/2019   COVID-19 Vaccine (2 - 2023-24 season) 11/04/2022 (Originally 07/16/2022)   Hepatitis C Screening  10/20/2023 (Originally 11/03/2003)   HIV Screening  10/20/2023 (Originally 11/02/2000)   INFLUENZA VACCINE  Completed   HPV VACCINES  Aged Out   Discussed health benefits of physical activity, and encouraged her to engage in regular exercise appropriate for her age and condition.  Problem List Items Addressed This Visit   None Visit Diagnoses     Preventative health care    -  Primary      No follow-ups on file.     Alysia Penna, NP

## 2022-10-19 NOTE — Assessment & Plan Note (Addendum)
Lesions on finger, neck and torso Hyperpigmented patches with lichenification Waxing and waxing. Minimal improvement with triamcinolone and aquafor cream Added eucrisa ointment to avoid prolong use of corticosteroid on and around breast tissue.

## 2022-10-20 LAB — CYTOLOGY - PAP
Comment: NEGATIVE
Diagnosis: NEGATIVE
High risk HPV: NEGATIVE

## 2022-10-28 ENCOUNTER — Telehealth: Admitting: Emergency Medicine

## 2022-10-28 DIAGNOSIS — J069 Acute upper respiratory infection, unspecified: Secondary | ICD-10-CM

## 2022-10-28 MED ORDER — FLUTICASONE PROPIONATE 50 MCG/ACT NA SUSP: 2.0000 | Freq: Every day | NASAL | 0 refills | Status: AC

## 2022-10-28 MED ORDER — BENZONATATE 100 MG PO CAPS: 100.0000 mg | ORAL_CAPSULE | Freq: Two times a day (BID) | ORAL | 0 refills | Status: DC | PRN

## 2022-10-28 NOTE — Progress Notes (Signed)
E-Visit for Upper Respiratory Infection   We are sorry you are not feeling well.  Here is how we plan to help!  Based on what you have shared with me, it looks like you may have a viral upper respiratory infection.  Upper respiratory infections are caused by a large number of viruses; however, rhinovirus is the most common cause.   Symptoms vary from person to person, with common symptoms including sore throat, cough, fatigue or lack of energy and feeling of general discomfort.  A low-grade fever of up to 100.4 may present, but is often uncommon.  Symptoms vary however, and are closely related to a person's age or underlying illnesses.  The most common symptoms associated with an upper respiratory infection are nasal discharge or congestion, cough, sneezing, headache and pressure in the ears and face.  These symptoms usually persist for about 3 to 10 days, but can last up to 2 weeks.  It is important to know that upper respiratory infections do not cause serious illness or complications in most cases.    Upper respiratory infections can be transmitted from person to person, with the most common method of transmission being a person's hands.  The virus is able to live on the skin and can infect other persons for up to 2 hours after direct contact.  Also, these can be transmitted when someone coughs or sneezes; thus, it is important to cover the mouth to reduce this risk.  To keep the spread of the illness at bay, good hand hygiene is very important.  This is an infection that is most likely caused by a virus. There are no specific treatments other than to help you with the symptoms until the infection runs its course.  We are sorry you are not feeling well.  Here is how we plan to help!   For nasal congestion, you may use an oral decongestants such as Mucinex D or if you have glaucoma or high blood pressure use plain Mucinex.    Saline nasal spray or nasal drops can help and can safely be used as often  as needed for congestion. Try using saline irrigation, such as with a neti pot, several times a day while you are sick. Many neti pots come with salt packets premeasured to use to make saline. If you use your own salt, make sure it is kosher salt or sea salt (don't use table salt as it has iodine in it and you don't need that in your nose). Use distilled water to make saline. If you mix your own saline using your own salt, the recipe is 1/4 teaspoon salt in 1 cup warm water. Using saline irrigation can help prevent and treat sinus infections.    For your congestion, I have prescribed Fluticasone nasal spray one spray in each nostril twice a day  If you do not have a history of heart disease, hypertension, diabetes or thyroid disease, prostate/bladder issues or glaucoma, you may also use Sudafed to treat nasal congestion.  It is highly recommended that you consult with a pharmacist or your primary care physician to ensure this medication is safe for you to take.     If you have a cough, you may use cough suppressants such as Delsym and Robitussin.  If you have glaucoma or high blood pressure, you can also use Coricidin HBP.   For cough I have prescribed for you A prescription cough medication called Tessalon Perles 100 mg. You may take 1-2 capsules every 8   hours as needed for cough  If you have a sore or scratchy throat, use a saltwater gargle-  to  teaspoon of salt dissolved in a 4-ounce to 8-ounce glass of warm water.  Gargle the solution for approximately 15-30 seconds and then spit.  It is important not to swallow the solution.  You can also use throat lozenges/cough drops and Chloraseptic spray to help with throat pain or discomfort.  Warm or cold liquids can also be helpful in relieving throat pain.  For headache, pain or general discomfort, you can use Ibuprofen or Tylenol as directed.   Some authorities believe that zinc sprays or the use of Echinacea may shorten the course of your  symptoms.   HOME CARE Only take medications as instructed by your medical team. Be sure to drink plenty of fluids. Water is fine as well as fruit juices, sodas and electrolyte beverages. You may want to stay away from caffeine or alcohol. If you are nauseated, try taking small sips of liquids. How do you know if you are getting enough fluid? Your urine should be a pale yellow or almost colorless. Get rest. Taking a steamy shower or using a humidifier may help nasal congestion and ease sore throat pain. You can place a towel over your head and breathe in the steam from hot water coming from a faucet. Using a saline nasal spray works much the same way. Cough drops, hard candies and sore throat lozenges may ease your cough. Avoid close contacts especially the very young and the elderly Cover your mouth if you cough or sneeze Always remember to wash your hands.   GET HELP RIGHT AWAY IF: You develop worsening fever. If your symptoms do not improve within 10 days You develop yellow or green discharge from your nose over 3 days. You have coughing fits You develop a severe head ache or visual changes. You develop shortness of breath, difficulty breathing or start having chest pain Your symptoms persist after you have completed your treatment plan  MAKE SURE YOU  Understand these instructions. Will watch your condition. Will get help right away if you are not doing well or get worse.  Thank you for choosing an e-visit.  Your e-visit answers were reviewed by a board certified advanced clinical practitioner to complete your personal care plan. Depending upon the condition, your plan could have included both over the counter or prescription medications.  Please review your pharmacy choice. Make sure the pharmacy is open so you can pick up prescription now. If there is a problem, you may contact your provider through MyChart messaging and have the prescription routed to another pharmacy.  Your  safety is important to us. If you have drug allergies check your prescription carefully.   For the next 24 hours you can use MyChart to ask questions about today's visit, request a non-urgent call back, or ask for a work or school excuse. You will get an email in the next two days asking about your experience. I hope that your e-visit has been valuable and will speed your recovery.   I have spent 5 minutes in review of e-visit questionnaire, review and updating patient chart, medical decision making and response to patient.   Isaiyah Feldhaus, PhD, FNP-BC    

## 2022-11-05 ENCOUNTER — Telehealth: Admitting: Physician Assistant

## 2022-11-05 DIAGNOSIS — Z20828 Contact with and (suspected) exposure to other viral communicable diseases: Secondary | ICD-10-CM

## 2022-11-05 MED ORDER — OSELTAMIVIR PHOSPHATE 75 MG PO CAPS: 75.0000 mg | ORAL_CAPSULE | Freq: Every day | ORAL | 0 refills | Status: DC

## 2022-11-05 NOTE — Progress Notes (Signed)
E visit for Flu like symptoms   We are sorry that you are not feeling well.  Here is how we plan to help! Based on what you have shared with me it looks like you may have possible exposure to a virus that causes influenza.  Influenza or "the flu" is   an infection caused by a respiratory virus. The flu virus is highly contagious and persons who did not receive their yearly flu vaccination may "catch" the flu from close contact.  We have anti-viral medications to treat the viruses that cause this infection. They are not a "cure" and only shorten the course of the infection. These prescriptions are most effective when they are given within the first 2 days of "flu" symptoms. Antiviral medication are indicated if you have a high risk of complications from the flu. You should  also consider an antiviral medication if you are in close contact with someone who is at risk. These medications can help patients avoid complications from the flu  but have side effects that you should know. Possible side effects from Tamiflu or oseltamivir include nausea, vomiting, diarrhea, dizziness, headaches, eye redness, sleep problems or other respiratory symptoms. You should not take Tamiflu if you have an allergy to oseltamivir or any to the ingredients in Tamiflu.  Based upon your symptoms and potential risk factors I have prescribed Oseltamivir (Tamiflu).  It has been sent to your designated pharmacy.  You will take one 75 mg capsule orally once a day for the next 10 days for prophylaxis.   ANYONE WHO HAS FLU SYMPTOMS SHOULD: Stay home. The flu is highly contagious and going out or to work exposes others! Be sure to drink plenty of fluids. Water is fine as well as fruit juices, sodas and electrolyte beverages. You may want to stay away from caffeine or alcohol. If you are nauseated, try taking small sips of liquids. How do you know if you are getting enough fluid? Your urine should be a pale yellow or almost  colorless. Get rest. Taking a steamy shower or using a humidifier may help nasal congestion and ease sore throat pain. Using a saline nasal spray works much the same way. Cough drops, hard candies and sore throat lozenges may ease your cough. Line up a caregiver. Have someone check on you regularly.   GET HELP RIGHT AWAY IF: You cannot keep down liquids or your medications. You become short of breath Your fell like you are going to pass out or loose consciousness. Your symptoms persist after you have completed your treatment plan MAKE SURE YOU  Understand these instructions. Will watch your condition. Will get help right away if you are not doing well or get worse.  Your e-visit answers were reviewed by a board certified advanced clinical practitioner to complete your personal care plan.  Depending on the condition, your plan could have included both over the counter or prescription medications.  If there is a problem please reply  once you have received a response from your provider.  Your safety is important to [place].  If you have drug allergies check your prescription carefully.    You can use MyChart to ask questions about today's visit, request a non-urgent call back, or ask for a work or school excuse for 24 hours related to this e-Visit. If it has been greater than 24 hours you will need to follow up with your provider, or enter a new e-Visit to address those concerns.  You will get an  e-mail in the next two days asking about your experience.  I hope that your e-visit has been valuable and will speed your recovery. Thank you for using e-visits.  I have spent 5 minutes in review of e-visit questionnaire, review and updating patient chart, medical decision making and response to patient.   Mar Daring, PA-C

## 2023-04-25 ENCOUNTER — Ambulatory Visit
Admission: RE | Admit: 2023-04-25 | Discharge: 2023-04-25 | Disposition: A | Discharge status: Home / Self Care | Source: Ambulatory Visit | Attending: Emergency Medicine | Admitting: Emergency Medicine

## 2023-04-25 ENCOUNTER — Telehealth: Admitting: Physician Assistant

## 2023-04-25 VITALS — BP 108/71 | HR 94 | Temp 98.8°F | Resp 18

## 2023-04-25 DIAGNOSIS — H1031 Unspecified acute conjunctivitis, right eye: Secondary | ICD-10-CM

## 2023-04-25 DIAGNOSIS — H5789 Other specified disorders of eye and adnexa: Secondary | ICD-10-CM

## 2023-04-25 DIAGNOSIS — H5711 Ocular pain, right eye: Secondary | ICD-10-CM

## 2023-04-25 DIAGNOSIS — L568 Other specified acute skin changes due to ultraviolet radiation: Secondary | ICD-10-CM

## 2023-04-25 MED ORDER — POLYMYXIN B-TRIMETHOPRIM 10000-0.1 UNIT/ML-% OP SOLN: 1.0000 [drp] | Freq: Four times a day (QID) | OPHTHALMIC | 0 refills | Status: AC

## 2023-04-25 NOTE — ED Provider Notes (Signed)
Renaldo Fiddler    CSN: [id number] Arrival date & time: 04/25/23  1752      History   Chief Complaint Chief Complaint  Patient presents with   Allergic Reaction    Pink eye - Entered by patient    HPI Ashley Mcdowell is a 37 y.o. female.  Patient presents with 1 day history of right eye redness, itching, drainage.  She reports light sensitivity.  No eye trauma, eye pain, change in vision, fever, chills, ear pain, sore throat, cough or other symptoms.  She has been treating her symptoms by not wearing her contact lenses today but instead wearing eyeglasses.  Patient had a telehealth visit today was instructed to be seen in person.  The history is provided by the patient and medical records.    History reviewed. No pertinent past medical history.  Patient Active Problem List   Diagnosis Date Noted   Flexural eczema 12/15/2016    History reviewed. No pertinent surgical history.  OB History   No obstetric history on file.      Home Medications    Prior to Admission medications   Medication Sig Start Date End Date Taking? Authorizing Provider  trimethoprim-polymyxin b (POLYTRIM) ophthalmic solution Place 1 drop into both eyes 4 (four) times daily for 7 days. 04/25/23 05/02/23 Yes Mickie Bail, NP  benzonatate (TESSALON) 100 MG capsule Take 1 capsule (100 mg total) by mouth 2 (two) times daily as needed for cough. 10/28/22   Cathlyn Parsons, NP  Crisaborole (EUCRISA) 2 % OINT Apply 1 Application topically daily at 12 noon. 10/19/22   Nche, Bonna Gains, NP  fluticasone (FLONASE) 50 MCG/ACT nasal spray Place 2 sprays into both nostrils daily. 10/28/22   Cathlyn Parsons, NP  ibuprofen (ADVIL) 600 MG tablet Take 1 tablet (600 mg total) by mouth 3 (three) times daily. With food 03/13/20   Nche, Bonna Gains, NP  oseltamivir (TAMIFLU) 75 MG capsule Take 1 capsule (75 mg total) by mouth daily. 11/05/22   Margaretann Loveless, PA-C  triamcinolone ointment (KENALOG) 0.5 % Apply  1 Application topically 2 (two) times daily. 10/19/22   Nche, Bonna Gains, NP    Family History Family History  Problem Relation Age of Onset   Hypertension Mother    Hyperlipidemia Father    Arthritis Father 23       rheumatoid   Cerebral aneurysm Father 38       secondary to cerebral infection   Hyperlipidemia Paternal Grandmother    Arthritis Paternal Grandmother        rheumatoid   Cancer Paternal Great-grandmother 17       breast    Social History Social History   Tobacco Use   Smoking status: Never   Smokeless tobacco: Never  Vaping Use   Vaping Use: Never used  Substance Use Topics   Alcohol use: Not Currently   Drug use: No     Allergies   Patient has no known allergies.   Review of Systems Review of Systems  Constitutional:  Negative for chills and fever.  HENT:  Negative for ear pain and sore throat.   Eyes:  Positive for discharge, redness and itching. Negative for pain and visual disturbance.  Respiratory:  Negative for cough and shortness of breath.      Physical Exam Triage Vital Signs ED Triage Vitals  Enc Vitals Group     BP 04/25/23 1853 108/71     Pulse Rate 04/25/23 1847 94  Resp 04/25/23 1847 18     Temp 04/25/23 1847 98.8 F (37.1 C)     Temp src --      SpO2 04/25/23 1847 98 %     Weight --      Height --      Head Circumference --      Peak Flow --      Pain Score 04/25/23 1852 3     Pain Loc --      Pain Edu? --      Excl. in GC? --    No data found.  Updated Vital Signs BP 108/71   Pulse 94   Temp 98.8 F (37.1 C)   Resp 18   LMP 04/13/2023   SpO2 98%   Visual Acuity Right Eye Distance:   Left Eye Distance:   Bilateral Distance:    Right Eye Near:   Left Eye Near:    Bilateral Near:     Physical Exam Vitals and nursing note reviewed.  Constitutional:      General: She is not in acute distress.    Appearance: Normal appearance. She is well-developed. She is not ill-appearing.  HENT:     Right Ear:  Tympanic membrane normal.     Left Ear: Tympanic membrane normal.     Nose: Nose normal.     Mouth/Throat:     Mouth: Mucous membranes are moist.     Pharynx: Oropharynx is clear.  Eyes:     General: Lids are normal. Vision grossly intact.     Extraocular Movements: Extraocular movements intact.     Conjunctiva/sclera:     Right eye: Right conjunctiva is injected.     Left eye: Left conjunctiva is not injected.     Pupils: Pupils are equal, round, and reactive to light.  Cardiovascular:     Rate and Rhythm: Normal rate and regular rhythm.     Heart sounds: Normal heart sounds.  Pulmonary:     Effort: Pulmonary effort is normal. No respiratory distress.     Breath sounds: Normal breath sounds.  Musculoskeletal:     Cervical back: Neck supple.  Skin:    General: Skin is warm and dry.  Neurological:     Mental Status: She is alert.  Psychiatric:        Mood and Affect: Mood normal.        Behavior: Behavior normal.      UC Treatments / Results  Labs (all labs ordered are listed, but only abnormal results are displayed) Labs Reviewed - No data to display  EKG   Radiology No results found.  Procedures Procedures (including critical care time)  Medications Ordered in UC Medications - No data to display  Initial Impression / Assessment and Plan / UC Course  I have reviewed the triage vital signs and the nursing notes.  Pertinent labs & imaging results that were available during my care of the patient were reviewed by me and considered in my medical decision making (see chart for details).    Bacterial conjunctivitis of right eye.  Treating with Polytrim eyedrops.  Education provided on conjunctivitis.  Instructed patient to follow-up with PCP if symptoms are not improving.  ED precautions discussed.  Patient agrees to plan of care.   Final Clinical Impressions(s) / UC Diagnoses   Final diagnoses:  Acute bacterial conjunctivitis of right eye     Discharge  Instructions      Use the antibiotic eyedrops as prescribed.  Follow-up with your primary care provider if your symptoms are not improving.    Go to the emergency department if you have acute eye pain, changes in your vision, or other concerning symptoms.        ED Prescriptions     Medication Sig Dispense Auth. Provider   trimethoprim-polymyxin b (POLYTRIM) ophthalmic solution Place 1 drop into both eyes 4 (four) times daily for 7 days. 10 mL Mickie Bail, NP      PDMP not reviewed this encounter.   Mickie Bail, NP 04/25/23 [phone]

## 2023-04-25 NOTE — ED Triage Notes (Signed)
Patient to Urgent Care with complaints of right sided eye drainage. Symptoms started yesterday. Has also had some light sensitivity and redness.

## 2023-04-25 NOTE — Discharge Instructions (Addendum)
Use the antibiotic eyedrops as prescribed.    Follow-up with your primary care provider if your symptoms are not improving.    Go to the emergency department if you have acute eye pain, changes in your vision, or other concerning symptoms.    

## 2023-04-25 NOTE — Progress Notes (Signed)
Because you are having pain, drainage and significant photo sensitivity, I feel your condition warrants further evaluation and I recommend that you be seen in a face to face visit. There are some eye infections that can actually affect vision long term and should be evaluated for in person.   NOTE: There will be NO CHARGE for this eVisit   If you are having a true medical emergency please call 911.      For an urgent face to face visit, [place] has eight urgent care centers for your convenience:   [facility] at [facility] Get Driving Directions [phone] [address] [place], [address]    [facility] Health Urgent Care Center at [facility] Get Driving Directions [phone] [address] [place], [place] [address]   [facility] Health Urgent Care Center [facility]) Get Driving Directions [phone] [address] [place], [place] [address]  [facility] Health Urgent Care Center [facility]) Get Driving Directions [phone] [address] [place],  [place]  [address]  [facility] Health Urgent Care Center [facility] - at [employer]  [phone] [place],  [place] [address]   [facility] Health Urgent Care at [facility] Get Driving Directions [phone] [address] [place], [place] [address]   [facility] Health Urgent Care at [facility] Get Driving Directions  [phone] [address] [place], [place] [address]   [facility] Health Urgent Care at [facility] Directions [phone] [address] [place], [place] [address]  Your MyChart E-visit questionnaire answers were reviewed by a board certified advanced clinical practitioner to complete your personal care plan based on your specific symptoms.  Thank you for using e-Visits.   I have spent 5 minutes in review of e-visit questionnaire,  review and updating patient chart, medical decision making and response to patient.   Margaretann Loveless, PA-C

## 2024-02-05 ENCOUNTER — Emergency Department

## 2024-02-05 ENCOUNTER — Emergency Department
Admission: EM | Admit: 2024-02-05 | Discharge: 2024-02-05 | Disposition: A | Discharge status: Home / Self Care | Attending: Emergency Medicine | Admitting: Emergency Medicine

## 2024-02-05 DIAGNOSIS — M542 Cervicalgia: Secondary | ICD-10-CM | POA: Diagnosis present

## 2024-02-05 DIAGNOSIS — S161XXA Strain of muscle, fascia and tendon at neck level, initial encounter: Secondary | ICD-10-CM | POA: Diagnosis not present

## 2024-02-05 DIAGNOSIS — Y92410 Unspecified street and highway as the place of occurrence of the external cause: Secondary | ICD-10-CM | POA: Insufficient documentation

## 2024-02-05 MED ORDER — MELOXICAM 15 MG PO TABS: 15.0000 mg | ORAL_TABLET | Freq: Every day | ORAL | 0 refills | Status: AC

## 2024-02-05 MED ORDER — METHOCARBAMOL 500 MG PO TABS: 500.0000 mg | ORAL_TABLET | Freq: Four times a day (QID) | ORAL | 0 refills | Status: AC

## 2024-02-05 MED ORDER — HYDROCODONE-ACETAMINOPHEN 5-325 MG PO TABS
1.0000 | ORAL_TABLET | Freq: Once | ORAL | Status: AC
  Administered 2024-02-05: 1 via ORAL
  Filled 2024-02-05: qty 1

## 2024-02-05 MED ORDER — METHOCARBAMOL 500 MG PO TABS
1000.0000 mg | ORAL_TABLET | Freq: Once | ORAL | Status: AC
  Administered 2024-02-05: 1000 mg via ORAL
  Filled 2024-02-05: qty 2
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
## 2024-02-08 ENCOUNTER — Telehealth: Payer: Self-pay

## 2024-02-08 NOTE — Transitions of Care (Post Inpatient/ED Visit) (Signed)
   02/08/2024  Name: Ashley Mcdowell MRN: [id number] DOB: [birth year]  Today's TOC FU Call Status: Today's TOC FU Call Status:: Unsuccessful Call (1st Attempt) Unsuccessful Call (1st Attempt) Date: 02/08/24  Attempted to reach the patient regarding the most recent Inpatient/ED visit.  Follow Up Plan: Additional outreach attempts will be made to reach the patient to complete the Transitions of Care (Post Inpatient/ED visit) call.   Signature Jenny Reichmann

## 2024-02-10 NOTE — Transitions of Care (Post Inpatient/ED Visit) (Signed)
   02/10/2024  Name: Ashley Mcdowell MRN: [id number] DOB: 1986-03-01  Today's TOC FU Call Status: Today's TOC FU Call Status:: Unsuccessful Call (2nd Attempt) Unsuccessful Call (1st Attempt) Date: 02/08/24 Unsuccessful Call (2nd Attempt) Date: 02/10/24  Attempted to reach the patient regarding the most recent Inpatient/ED visit.  Follow Up Plan: Additional outreach attempts will be made to reach the patient to complete the Transitions of Care (Post Inpatient/ED visit) call.   Signature Jenny Reichmann

## 2024-02-21 ENCOUNTER — Encounter: Payer: Self-pay | Admitting: Nurse Practitioner

## 2024-02-21 ENCOUNTER — Ambulatory Visit: Admitting: Nurse Practitioner

## 2024-02-21 VITALS — BP 116/76 | HR 76

## 2024-02-21 DIAGNOSIS — S134XXD Sprain of ligaments of cervical spine, subsequent encounter: Secondary | ICD-10-CM

## 2024-02-21 NOTE — Patient Instructions (Signed)
 Continue naproxen and robaxin Alternate between warm and cold compress as needed Schedule appointment with PT when contacted  Cervical Sprain A cervical sprain is also called a neck sprain. It is a stretch or tear in one or more ligaments in the neck. Ligaments are tissues that connect bones to each other. Neck sprains can be mild, bad, or very bad. A very bad sprain can cause problems with bones and other structures.  Most neck sprains heal in 4-6 weeks, but this depends on how bad the injury is. What are the causes? Neck sprains may be caused by trauma, such as: An injury from a motor vehicle accident. A fall. The head and neck being moved front to back or side to side all of a sudden (whiplash injury). Mild neck sprains may be caused by wear and tear over time. What increases the risk? Some activities put you at risk of hurting your neck. These include: Contact sports. Gymnastics. Diving. Arthritis caused by wear and tear of the joints in the spine. The neck not being very strong or flexible. Having had a neck injury in the past. Poor posture. Spending a lot of time in positions that put stress on the neck. This may be from sitting at a computer for a long time. What are the signs or symptoms? Signs or symptoms include any of these problems in your neck, shoulders, or upper back: Pain or tenderness. Stiffness. Swelling. A burning feeling. Sudden tightening of neck muscles (spasms). Not being able to move the neck very much. Headache. Feeling dizzy. Feeling like you may vomit, or vomiting. Having a hand or arm that: Feels weak. Loses feeling (feels numb). Tingles. How is this treated? This condition is treated by: Resting and putting ice or heat on your neck. Doing exercises to improve movement and strength in the injured area (physical therapy). In some cases, treatment may also include: Keeping your neck in place for a length of time. This may be done using: A neck  collar. This supports your chin and the back of your head. A cervical traction device. This is a sling that holds up your head. It removes weight and pressure from your neck. Medicines for pain or other symptoms. Surgery. This is rare. Follow these instructions at home: Medicines Take over-the-counter and prescription medicines only as told by your doctor. Ask your doctor if you should avoid driving or using machines while you are taking your medicine. If told, take steps to prevent problems with pooping (constipation). You may need to: Drink enough fluid to keep your pee (urine) pale yellow. Take medicines. You will be told what medicines to take. Eat foods that are high in fiber. These include beans, whole grains, and fresh fruits and vegetables. Limit foods that are high in fat and sugar. These include fried or sweet foods. If you have a neck collar: Wear it as told by your doctor. Do not take it off unless told. Ask your doctor before adjusting your collar. If you have long hair, keep it outside of the collar. If you are allowed to take off the collar for cleaning and bathing: Follow instructions about how to take it off safely. Clean it by hand with mild soap and water. Let it air-dry fully. If your collar has pads that you can take out: Take the pads out every 1-2 days. Wash them by hand with soap and water. Let the pads air-dry fully before you put them back in the collar. Tell your doctor if your  skin under the collar has irritation or sores. Managing pain, stiffness, and swelling     Use a cervical traction device, if told by your doctor. If told, put ice on the affected area. Put ice in a plastic bag. Place a towel between your skin and the bag. Leave the ice on for 20 minutes, 2-3 times a day. If told, put heat on the affected area. Do this before exercise or as often as told by your doctor. Use the heat source that your doctor recommends, such as a moist heat pack or a  heating pad. Place a towel between your skin and the heat source. Leave the heat on for 20-30 minutes. If your skin turns bright red, take off the ice or heat right away to prevent skin damage. The risk of damage is higher if you cannot feel pain, heat, or cold. Activity Do not drive while wearing a neck collar. If you do not have a neck collar, ask if it is safe to drive while your neck heals. Do not lift anything that is heavier than 10 lb (4.5 kg). Rest as told by your doctor. Avoid positions and activities that make you feel worse. Do exercises as told by your doctor or physical therapist. Return to your normal activities when your doctor says that it is safe. General instructions Do not smoke or use any products that contain nicotine or tobacco. These can delay healing. If you need help quitting, ask your doctor. Keep all follow-up visits. Your doctor will check your injury and activity level. How is this prevented? Use good posture. Adjust your workstation to help with this. Exercise often as told by your doctor or physical therapist. Avoid activities that are risky or may cause a neck sprain. Contact a doctor if: Your symptoms get worse or do not get better after 2 weeks. Your pain gets worse. Medicine does not help your pain. You have new symptoms. Your neck collar gives you sores on your skin or bothers your skin. Get help right away if: You have very bad pain. You get any of the following in any part of your body: Loss of feeling. Tingling. Weakness. You cannot move a part of your body. You have neck pain and either of these: Very bad dizziness. A very bad headache. This information is not intended to replace advice given to you by your health care provider. Make sure you discuss any questions you have with your health care provider. Document Revised: 06/04/2022 Document Reviewed: 06/04/2022 Elsevier Patient Education  2024 [employer].
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
## 2024-03-23 ENCOUNTER — Ambulatory Visit: Attending: Nurse Practitioner

## 2024-03-23 DIAGNOSIS — S134XXD Sprain of ligaments of cervical spine, subsequent encounter: Secondary | ICD-10-CM | POA: Diagnosis present

## 2024-03-23 DIAGNOSIS — M542 Cervicalgia: Secondary | ICD-10-CM | POA: Diagnosis present

## 2024-04-06 ENCOUNTER — Ambulatory Visit: Admitting: Physical Therapy

## 2024-04-06 DIAGNOSIS — S134XXD Sprain of ligaments of cervical spine, subsequent encounter: Secondary | ICD-10-CM | POA: Diagnosis not present
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
## 2024-04-11 ENCOUNTER — Telehealth: Admitting: Physician Assistant

## 2024-04-11 DIAGNOSIS — L309 Dermatitis, unspecified: Secondary | ICD-10-CM

## 2024-04-11 MED ORDER — TRIAMCINOLONE ACETONIDE 0.1 % EX CREA: 1.0000 | TOPICAL_CREAM | Freq: Two times a day (BID) | CUTANEOUS | 0 refills | Status: DC

## 2024-04-11 NOTE — Progress Notes (Signed)
 I have spent 5 minutes in review of e-visit questionnaire, review and updating patient chart, medical decision making and response to patient.   Piedad Climes, PA-C

## 2024-04-11 NOTE — Progress Notes (Signed)

## 2024-04-13 ENCOUNTER — Ambulatory Visit: Admitting: Physical Therapy

## 2024-04-13 DIAGNOSIS — M542 Cervicalgia: Secondary | ICD-10-CM

## 2024-04-13 DIAGNOSIS — S134XXD Sprain of ligaments of cervical spine, subsequent encounter: Secondary | ICD-10-CM | POA: Diagnosis not present

## 2024-04-13 NOTE — Patient Instructions (Signed)
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
## 2024-04-20 ENCOUNTER — Encounter: Payer: Self-pay | Admitting: Nurse Practitioner

## 2024-04-20 ENCOUNTER — Ambulatory Visit: Admitting: Nurse Practitioner

## 2024-04-20 ENCOUNTER — Ambulatory Visit: Attending: Nurse Practitioner

## 2024-04-20 VITALS — BP 122/76 | HR 66 | Temp 98.7°F | Ht 63.0 in | Wt 187.0 lb

## 2024-04-20 DIAGNOSIS — L2082 Flexural eczema: Secondary | ICD-10-CM | POA: Diagnosis not present

## 2024-04-20 DIAGNOSIS — Z136 Encounter for screening for cardiovascular disorders: Secondary | ICD-10-CM

## 2024-04-20 DIAGNOSIS — S134XXD Sprain of ligaments of cervical spine, subsequent encounter: Secondary | ICD-10-CM | POA: Insufficient documentation

## 2024-04-20 DIAGNOSIS — Z13220 Encounter for screening for lipoid disorders: Secondary | ICD-10-CM | POA: Diagnosis not present

## 2024-04-20 DIAGNOSIS — Z0001 Encounter for general adult medical examination with abnormal findings: Secondary | ICD-10-CM

## 2024-04-20 DIAGNOSIS — M542 Cervicalgia: Secondary | ICD-10-CM | POA: Diagnosis present

## 2024-04-20 LAB — CBC
HCT: 36.5 % (ref 36.0–46.0)
Hemoglobin: 12.1 g/dL (ref 12.0–15.0)
MCHC: 33 g/dL (ref 30.0–36.0)
MCV: 87.5 fl (ref 78.0–100.0)
Platelets: 263 10*3/uL (ref 150.0–400.0)
RBC: 4.17 Mil/uL (ref 3.87–5.11)
RDW: 14.3 % (ref 11.5–15.5)
WBC: 4.4 10*3/uL (ref 4.0–10.5)

## 2024-04-20 LAB — COMPREHENSIVE METABOLIC PANEL WITH GFR
ALT: 25 U/L (ref 0–35)
AST: 29 U/L (ref 0–37)
Albumin: 4.1 g/dL (ref 3.5–5.2)
Alkaline Phosphatase: 45 U/L (ref 39–117)
BUN: 9 mg/dL (ref 6–23)
CO2: 26 meq/L (ref 19–32)
Calcium: 9.3 mg/dL (ref 8.4–10.5)
Chloride: 106 meq/L (ref 96–112)
Creatinine, Ser: 0.83 mg/dL (ref 0.40–1.20)
GFR: 89.4 mL/min (ref >60.00)
Glucose, Bld: 74 mg/dL (ref 70–99)
Potassium: 3.5 meq/L (ref 3.5–5.1)
Sodium: 138 meq/L (ref 135–145)
Total Bilirubin: 0.6 mg/dL (ref 0.2–1.2)
Total Protein: 7.5 g/dL (ref 6.0–8.3)

## 2024-04-20 LAB — LIPID PANEL
Cholesterol: 162 mg/dL (ref 0–200)
HDL: 39.6 mg/dL (ref >39.00)
LDL Cholesterol: 112 mg/dL — ABNORMAL HIGH (ref 0–99)
NonHDL: 121.93
Total CHOL/HDL Ratio: 4
Triglycerides: 50 mg/dL (ref 0.0–149.0)
VLDL: 10 mg/dL (ref 0.0–40.0)

## 2024-04-20 MED ORDER — TRIAMCINOLONE ACETONIDE 0.1 % EX CREA: 1.0000 | TOPICAL_CREAM | Freq: Two times a day (BID) | CUTANEOUS | 0 refills | Status: AC

## 2024-04-20 NOTE — Progress Notes (Signed)
 Complete physical exam  Patient: Ashley Mcdowell   DOB: 08-21-1986   38 y.o. Female  MRN: [id number] Visit Date: 04/20/2024  Subjective:     Chief Complaint  Patient presents with   Annual Exam   Ashley Mcdowell is a 38 y.o. female who presents today for a complete physical exam. She reports consuming a general diet. Home exercise routine includes cardio and weight training. She generally feels well. She reports sleeping well. She does have additional problems to discuss today.  Vision:No Dental:Yes STD Screen:No  BP Readings from Last 3 Encounters:  04/20/24 122/76  02/21/24 116/76  02/05/24 134/87   Wt Readings from Last 3 Encounters:  04/20/24 187 lb (84.8 kg)  02/05/24 170 lb (77.1 kg)  10/19/22 205 lb 9.6 oz (93.3 kg)    Most recent fall risk assessment:    04/20/2024    1:05 PM  Fall Risk   Falls in the past year? 0  Injury with Fall? 0  Risk for fall due to : No Fall Risks  Follow up Falls evaluation completed     Depression screen:Yes - No Depression Most recent depression screenings:    04/20/2024    1:06 PM 10/19/2022    2:07 PM  PHQ 2/9 Scores  PHQ - 2 Score 0 0  PHQ- 9 Score 2 2    HPI  No problem-specific Assessment & Plan notes found for this encounter.   History reviewed. No pertinent past medical history. History reviewed. No pertinent surgical history. Social History   Socioeconomic History   Marital status: Married    Spouse name: Not on file   Number of children: Not on file   Years of education: Not on file   Highest education level: Bachelor's degree (e.g., BA, AB, BS)  Occupational History   Not on file  Tobacco Use   Smoking status: Never   Smokeless tobacco: Never  Vaping Use   Vaping status: Never Used  Substance and Sexual Activity   Alcohol use: Not Currently   Drug use: No   Sexual activity: Yes    Birth control/protection: None, Other-see comments, Surgical    Comment: husband has vasectomy  Other Topics Concern    Not on file  Social History Narrative   Not on file   Social Drivers of Health   Financial Resource Strain: Low Risk  (04/20/2024)   Overall Financial Resource Strain (CARDIA)    Difficulty of Paying Living Expenses: Not hard at all  Food Insecurity: No Food Insecurity (04/20/2024)   Hunger Vital Sign    Worried About Running Out of Food in the Last Year: Never true    Ran Out of Food in the Last Year: Never true  Transportation Needs: No Transportation Needs (04/20/2024)   PRAPARE - Administrator, Civil Service (Medical): No    Lack of Transportation (Non-Medical): No  Physical Activity: Sufficiently Active (04/20/2024)   Exercise Vital Sign    Days of Exercise per Week: 3 days    Minutes of Exercise per Session: 50 min  Stress: No Stress Concern Present (04/20/2024)   [employer] of Occupational Health - Occupational Stress Questionnaire    Feeling of Stress : Only a little  Social Connections: Socially Integrated (04/20/2024)   Social Connection and Isolation Panel [NHANES]    Frequency of Communication with Friends and Family: Three times a week    Frequency of Social Gatherings with Friends and Family: Once a week    Attends  Religious Services: More than 4 times per year    Active Member of Clubs or Organizations: Yes    Attends Banker Meetings: 1 to 4 times per year    Marital Status: Married  Catering manager Violence: Not on file   Family Status  Relation Name Status   Mother  Alive   Father  Deceased   Sister  Alive   Daughter  Alive   Son  Alive   PGM  Alive   Paternal GGM  Alive  No partnership data on file   Family History  Problem Relation Age of Onset   Hypertension Mother    Hyperlipidemia Father    Arthritis Father 46       rheumatoid   Cerebral aneurysm Father 77       secondary to cerebral infection   Hyperlipidemia Paternal Grandmother    Arthritis Paternal Grandmother        rheumatoid   Cancer Paternal Great-grandmother  60       breast   No Known Allergies  Patient Care Team: Luisdavid Hamblin, Connye Delaine, NP as PCP - General (Internal Medicine)   Medications: Outpatient Medications Prior to Visit  Medication Sig   fluticasone  (FLONASE ) 50 MCG/ACT nasal spray Place 2 sprays into both nostrils daily. (Patient taking differently: Place 2 sprays into both nostrils as needed.)   meloxicam  (MOBIC ) 15 MG tablet Take 1 tablet (15 mg total) by mouth daily. (Patient taking differently: Take 15 mg by mouth as needed for pain.)   methocarbamol  (ROBAXIN ) 500 MG tablet Take 1 tablet (500 mg total) by mouth 4 (four) times daily. (Patient taking differently: Take 500 mg by mouth as needed for muscle spasms.)   [DISCONTINUED] triamcinolone  cream (KENALOG ) 0.1 % Apply 1 Application topically 2 (two) times daily.   No facility-administered medications prior to visit.    Review of Systems  Constitutional:  Negative for activity change, appetite change and unexpected weight change.  Respiratory: Negative.    Cardiovascular: Negative.   Gastrointestinal: Negative.   Endocrine: Negative for cold intolerance and heat intolerance.  Genitourinary: Negative.   Musculoskeletal: Negative.   Skin: Negative.   Neurological: Negative.   Hematological: Negative.   Psychiatric/Behavioral:  Negative for behavioral problems, decreased concentration, dysphoric mood, hallucinations, self-injury, sleep disturbance and suicidal ideas. The patient is not nervous/anxious.         Objective:  BP 122/76 (BP Location: Left Arm, Patient Position: Sitting, Cuff Size: Large)   Pulse 66   Temp 98.7 F (37.1 C) (Oral)   Ht 5\' 3"  (1.6 m)   Wt 187 lb (84.8 kg)   LMP 04/09/2024   SpO2 100%   BMI 33.13 kg/m     Physical Exam Vitals and nursing note reviewed.  Constitutional:      General: She is not in acute distress. HENT:     Right Ear: Tympanic membrane, ear canal and external ear normal.     Left Ear: Tympanic membrane, ear canal and  external ear normal.     Nose: Nose normal.  Eyes:     Extraocular Movements: Extraocular movements intact.     Conjunctiva/sclera: Conjunctivae normal.     Pupils: Pupils are equal, round, and reactive to light.  Neck:     Thyroid : No thyroid  mass, thyromegaly or thyroid  tenderness.  Cardiovascular:     Rate and Rhythm: Normal rate and regular rhythm.     Pulses: Normal pulses.     Heart sounds: Normal heart sounds.  Pulmonary:  Effort: Pulmonary effort is normal.     Breath sounds: Normal breath sounds.  Abdominal:     General: Bowel sounds are normal.     Palpations: Abdomen is soft.  Musculoskeletal:        General: Normal range of motion.     Cervical back: Normal range of motion and neck supple.     Right lower leg: No edema.     Left lower leg: No edema.  Lymphadenopathy:     Cervical: No cervical adenopathy.  Skin:    General: Skin is warm and dry.     Findings: Rash present. No erythema.  Neurological:     Mental Status: She is alert and oriented to person, place, and time.     Cranial Nerves: No cranial nerve deficit.  Psychiatric:        Mood and Affect: Mood normal.        Behavior: Behavior normal.        Thought Content: Thought content normal.     No results found for any visits on 04/20/24.    Assessment & Plan:    Routine Health Maintenance and Physical Exam  Immunization History  Administered Date(s) Administered   Hep B, Unspecified 09/04/1997, 10/08/1997, 03/13/1998   Influenza Inj Mdck Quad Pf 09/25/2022   Influenza,inj,Quad PF,6+ Mos 12/15/2016, 09/09/2017, 09/06/2018   Influenza-Unspecified 08/29/2020, 09/29/2022   Meningococcal Mcv4o 04/24/2018   PFIZER(Purple Top)SARS-COV-2 Vaccination 12/04/2019, 07/04/2020   PPD Test 04/05/2017   Tdap 10/19/2022    Health Maintenance  Topic Date Due   COVID-19 Vaccine (3 - 2024-25 season) 05/06/2024 (Originally 07/17/2023)   Hepatitis C Screening  04/20/2025 (Originally 11/03/2003)   HIV Screening   04/20/2025 (Originally 11/02/2000)   INFLUENZA VACCINE  06/15/2024   Cervical Cancer Screening (HPV/Pap Cotest)  10/20/2027   DTaP/Tdap/Td (2 - Td or Tdap) 10/19/2032   HPV VACCINES  Aged Out   Meningococcal B Vaccine  Aged Out   Discussed health benefits of physical activity, and encouraged her to engage in regular exercise appropriate for her age and condition.  Problem List Items Addressed This Visit     Flexural eczema   Relevant Medications   triamcinolone  cream (KENALOG ) 0.1 %   Other Visit Diagnoses       Encounter for preventative adult health care exam with abnormal findings    -  Primary   Relevant Orders   Comprehensive metabolic panel with GFR   CBC   Lipid panel     Encounter for lipid screening for cardiovascular disease       Relevant Orders   Lipid panel      Return in about 1 year (around 04/20/2025) for CPE (fasting).     [name], NP

## 2024-04-20 NOTE — Patient Instructions (Signed)
 Go to lab Maintain Heart healthy diet and daily exercise. Maintain current medications.

## 2024-04-24 ENCOUNTER — Ambulatory Visit: Payer: Self-pay | Admitting: Nurse Practitioner
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
## 2024-04-27 ENCOUNTER — Ambulatory Visit

## 2024-04-27 DIAGNOSIS — M542 Cervicalgia: Secondary | ICD-10-CM

## 2024-04-27 DIAGNOSIS — S134XXD Sprain of ligaments of cervical spine, subsequent encounter: Secondary | ICD-10-CM | POA: Diagnosis not present

## 2024-05-25 ENCOUNTER — Ambulatory Visit: Attending: Nurse Practitioner | Admitting: Physical Therapy

## 2024-05-25 DIAGNOSIS — M542 Cervicalgia: Secondary | ICD-10-CM | POA: Diagnosis present

## 2024-05-25 DIAGNOSIS — S134XXD Sprain of ligaments of cervical spine, subsequent encounter: Secondary | ICD-10-CM | POA: Insufficient documentation
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
## 2024-06-01 ENCOUNTER — Ambulatory Visit
Admission: RE | Admit: 2024-06-01 | Discharge: 2024-06-01 | Disposition: A | Discharge status: Home / Self Care | Source: Ambulatory Visit | Attending: Emergency Medicine | Admitting: Emergency Medicine

## 2024-06-01 ENCOUNTER — Ambulatory Visit

## 2024-06-01 VITALS — BP 112/77 | HR 68 | Temp 98.5°F | Resp 18

## 2024-06-01 DIAGNOSIS — M542 Cervicalgia: Secondary | ICD-10-CM | POA: Diagnosis not present

## 2024-06-01 DIAGNOSIS — S134XXD Sprain of ligaments of cervical spine, subsequent encounter: Secondary | ICD-10-CM

## 2024-06-01 DIAGNOSIS — L03011 Cellulitis of right finger: Secondary | ICD-10-CM

## 2024-06-01 MED ORDER — CEPHALEXIN 500 MG PO CAPS: 500.0000 mg | ORAL_CAPSULE | Freq: Three times a day (TID) | ORAL | 0 refills | Status: AC

## 2024-06-01 NOTE — ED Triage Notes (Signed)
 Patient reports pain and swelling to right thumb x 5 days. Swelling noted. Patient has taken anything for it. Rates pain 6/10.

## 2024-06-01 NOTE — ED Provider Notes (Signed)
 [name]    CSN: [id number] Arrival date & time: 06/01/24  1138      History   Chief Complaint Chief Complaint  Patient presents with   Hand Problem    My right thumb seems to have an abscess. Its painful and theobs and is swollen only on the Right side of my nailbed - Entered by patient    HPI Ashley Mcdowell is a 38 y.o. female.   Patient presents for evaluation of pain and swelling to the right thumb beginning 5 days ago after removal of hangnail.  Denies drainage or fever.  Has not attempted treatment.  Denies injury.   History reviewed. No pertinent past medical history.  Patient Active Problem List   Diagnosis Date Noted   Flexural eczema 12/15/2016    History reviewed. No pertinent surgical history.  OB History   No obstetric history on file.      Home Medications    Prior to Admission medications   Medication Sig Start Date End Date Taking? Authorizing Provider  cephALEXin  (KEFLEX ) 500 MG capsule Take 1 capsule (500 mg total) by mouth 3 (three) times daily for 5 days. 06/01/24 06/06/24 Yes Arrionna Serena, Shelba SAUNDERS, NP  fluticasone  (FLONASE ) 50 MCG/ACT nasal spray Place 2 sprays into both nostrils daily. Patient taking differently: Place 2 sprays into both nostrils as needed. 10/28/22   Richad Jon HERO, NP  meloxicam  (MOBIC ) 15 MG tablet Take 1 tablet (15 mg total) by mouth daily. Patient taking differently: Take 15 mg by mouth as needed for pain. 02/05/24 02/04/25  Cuthriell, Dorn BIRCH, PA-C  methocarbamol  (ROBAXIN ) 500 MG tablet Take 1 tablet (500 mg total) by mouth 4 (four) times daily. Patient taking differently: Take 500 mg by mouth as needed for muscle spasms. 02/05/24   Cuthriell, Jonathan D, PA-C  triamcinolone  cream (KENALOG ) 0.1 % Apply 1 Application topically 2 (two) times daily. 04/20/24   Nche, Roselie Rockford, NP    Family History Family History  Problem Relation Age of Onset   Hypertension Mother    Hyperlipidemia Father    Arthritis Father  69       rheumatoid   Cerebral aneurysm Father 16       secondary to cerebral infection   Hyperlipidemia Paternal Grandmother    Arthritis Paternal Grandmother        rheumatoid   Cancer Paternal Great-grandmother 88       breast    Social History Social History   Tobacco Use   Smoking status: Never   Smokeless tobacco: Never  Vaping Use   Vaping status: Never Used  Substance Use Topics   Alcohol use: Not Currently   Drug use: No     Allergies   Patient has no known allergies.   Review of Systems Review of Systems   Physical Exam Triage Vital Signs ED Triage Vitals  Encounter Vitals Group     BP 06/01/24 1143 112/77     Girls Systolic BP Percentile --      Girls Diastolic BP Percentile --      Boys Systolic BP Percentile --      Boys Diastolic BP Percentile --      Pulse Rate 06/01/24 1143 68     Resp 06/01/24 1143 18     Temp 06/01/24 1143 98.5 F (36.9 C)     Temp Source 06/01/24 1143 Oral     SpO2 06/01/24 1143 100 %     Weight --  Height --      Head Circumference --      Peak Flow --      Pain Score 06/01/24 1146 5     Pain Loc --      Pain Education --      Exclude from Growth Chart --    No data found.  Updated Vital Signs BP 112/77 (BP Location: Left Arm)   Pulse 68   Temp 98.5 F (36.9 C) (Oral)   Resp 18   LMP 05/14/2024 (Exact Date)   SpO2 100%   Visual Acuity Right Eye Distance:   Left Eye Distance:   Bilateral Distance:    Right Eye Near:   Left Eye Near:    Bilateral Near:     Physical Exam Constitutional:      Appearance: Normal appearance.  Eyes:     Extraocular Movements: Extraocular movements intact.  Pulmonary:     Effort: Pulmonary effort is normal.  Skin:    Comments: Moderate swelling and tenderness present to the medial aspect of the right thumb nailbed, skin warm to touch, no drainage noted, sensation intact, capillary fill less than 3  Neurological:     Mental Status: She is alert and oriented to  person, place, and time. Mental status is at baseline.      UC Treatments / Results  Labs (all labs ordered are listed, but only abnormal results are displayed) Labs Reviewed - No data to display  EKG   Radiology No results found.  Procedures Procedures (including critical care time)  Medications Ordered in UC Medications - No data to display  Initial Impression / Assessment and Plan / UC Course  I have reviewed the triage vital signs and the nursing notes.  Pertinent labs & imaging results that were available during my care of the patient were reviewed by me and considered in my medical decision making (see chart for details).  Paronychia of the right thumb  Presentation consistent with  infection, prescribed cephalexin , recommended supportive care and advised follow-up for any delays in healing Final Clinical Impressions(s) / UC Diagnoses   Final diagnoses:  Paronychia of right thumb     Discharge Instructions      Your evaluated for the pain and the swelling of your thumb which is consistent with infection  Take cephalexin  every 8 hours for 5 days to clear germs contributing to symptoms  You may take Tylenol  and/or Motrin  as needed for pain  You may help warm compresses over the affected area as needed to help reduce swelling  You may return to clinic for reevaluation if any concerns regarding healing   ED Prescriptions     Medication Sig Dispense Auth. Provider   cephALEXin  (KEFLEX ) 500 MG capsule Take 1 capsule (500 mg total) by mouth 3 (three) times daily for 5 days. 15 capsule Dimitrios Balestrieri R, NP      PDMP not reviewed this encounter.   Teresa Shelba SAUNDERS, NP 06/01/24 1210

## 2024-06-01 NOTE — Discharge Instructions (Signed)
 Your evaluated for the pain and the swelling of your thumb which is consistent with infection  Take cephalexin  every 8 hours for 5 days to clear germs contributing to symptoms  You may take Tylenol  and/or Motrin  as needed for pain  You may help warm compresses over the affected area as needed to help reduce swelling  You may return to clinic for reevaluation if any concerns regarding healing

## 2024-07-13 ENCOUNTER — Ambulatory Visit (INDEPENDENT_AMBULATORY_CARE_PROVIDER_SITE_OTHER): Admitting: Nurse Practitioner

## 2024-07-13 VITALS — BP 118/70 | HR 70 | Temp 98.0°F | Ht 63.0 in | Wt 171.8 lb

## 2024-07-13 DIAGNOSIS — S134XXD Sprain of ligaments of cervical spine, subsequent encounter: Secondary | ICD-10-CM
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
## 2024-07-13 NOTE — Patient Instructions (Signed)
 Continue home exercises Use robaxin  and NSAIDs as needed Consider massage therapy if affordable

## 2024-12-10 ENCOUNTER — Encounter: Payer: Self-pay | Admitting: Nurse Practitioner

## 2025-04-26 ENCOUNTER — Encounter: Admitting: Nurse Practitioner
